# Patient Record
Sex: Female | Born: 1975 | Race: Black or African American | Hispanic: No | Marital: Married | Smoking: Never smoker
Health system: Southern US, Community
[De-identification: ages and names within clinical notes are randomized; demographics above are authoritative.]

## PROBLEM LIST (undated history)

## (undated) DIAGNOSIS — E559 Vitamin D deficiency, unspecified: Secondary | ICD-10-CM

## (undated) DIAGNOSIS — K429 Umbilical hernia without obstruction or gangrene: Secondary | ICD-10-CM

## (undated) DIAGNOSIS — Z8619 Personal history of other infectious and parasitic diseases: Secondary | ICD-10-CM

## (undated) DIAGNOSIS — E876 Hypokalemia: Secondary | ICD-10-CM

## (undated) DIAGNOSIS — I10 Essential (primary) hypertension: Secondary | ICD-10-CM

## (undated) DIAGNOSIS — A6000 Herpesviral infection of urogenital system, unspecified: Secondary | ICD-10-CM

## (undated) DIAGNOSIS — R7303 Prediabetes: Secondary | ICD-10-CM

## (undated) HISTORY — DX: Personal history of other infectious and parasitic diseases: Z86.19

## (undated) HISTORY — DX: Essential (primary) hypertension: I10

## (undated) HISTORY — DX: Hypokalemia: E87.6

## (undated) HISTORY — DX: Prediabetes: R73.03

## (undated) HISTORY — PX: WISDOM TOOTH EXTRACTION: SHX21

## (undated) HISTORY — DX: Herpesviral infection of urogenital system, unspecified: A60.00

## (undated) HISTORY — DX: Vitamin D deficiency, unspecified: E55.9

---

## 1998-07-10 ENCOUNTER — Emergency Department (HOSPITAL_COMMUNITY): Admission: EM | Admit: 1998-07-10 | Discharge: 1998-07-10 | Payer: Self-pay | Admitting: Emergency Medicine

## 1998-08-02 ENCOUNTER — Ambulatory Visit (HOSPITAL_COMMUNITY): Admission: RE | Admit: 1998-08-02 | Discharge: 1998-08-02 | Payer: Self-pay | Admitting: Emergency Medicine

## 1998-08-02 ENCOUNTER — Encounter: Payer: Self-pay | Admitting: Emergency Medicine

## 1998-08-02 ENCOUNTER — Emergency Department (HOSPITAL_COMMUNITY): Admission: EM | Admit: 1998-08-02 | Discharge: 1998-08-02 | Payer: Self-pay | Admitting: Emergency Medicine

## 1998-08-13 ENCOUNTER — Emergency Department (HOSPITAL_COMMUNITY): Admission: EM | Admit: 1998-08-13 | Discharge: 1998-08-13 | Payer: Self-pay

## 1998-10-21 ENCOUNTER — Other Ambulatory Visit: Admission: RE | Admit: 1998-10-21 | Discharge: 1998-10-21 | Payer: Self-pay | Admitting: *Deleted

## 1999-03-14 ENCOUNTER — Inpatient Hospital Stay (HOSPITAL_COMMUNITY): Admission: AD | Admit: 1999-03-14 | Discharge: 1999-03-14 | Payer: Self-pay | Admitting: *Deleted

## 1999-03-15 ENCOUNTER — Inpatient Hospital Stay (HOSPITAL_COMMUNITY): Admission: AD | Admit: 1999-03-15 | Discharge: 1999-03-17 | Payer: Self-pay | Admitting: Obstetrics and Gynecology

## 1999-09-21 ENCOUNTER — Emergency Department (HOSPITAL_COMMUNITY): Admission: EM | Admit: 1999-09-21 | Discharge: 1999-09-21 | Payer: Self-pay | Admitting: *Deleted

## 2000-05-03 ENCOUNTER — Encounter: Payer: Self-pay | Admitting: Emergency Medicine

## 2000-05-03 ENCOUNTER — Emergency Department (HOSPITAL_COMMUNITY): Admission: EM | Admit: 2000-05-03 | Discharge: 2000-05-03 | Payer: Self-pay | Admitting: Emergency Medicine

## 2000-05-18 ENCOUNTER — Emergency Department (HOSPITAL_COMMUNITY): Admission: EM | Admit: 2000-05-18 | Discharge: 2000-05-18 | Payer: Self-pay | Admitting: Emergency Medicine

## 2000-05-18 ENCOUNTER — Encounter: Payer: Self-pay | Admitting: Emergency Medicine

## 2000-11-17 ENCOUNTER — Encounter: Payer: Self-pay | Admitting: Emergency Medicine

## 2000-11-17 ENCOUNTER — Emergency Department (HOSPITAL_COMMUNITY): Admission: EM | Admit: 2000-11-17 | Discharge: 2000-11-17 | Payer: Self-pay | Admitting: Emergency Medicine

## 2001-01-30 ENCOUNTER — Other Ambulatory Visit: Admission: RE | Admit: 2001-01-30 | Discharge: 2001-01-30 | Payer: Self-pay | Admitting: Obstetrics and Gynecology

## 2001-06-04 ENCOUNTER — Inpatient Hospital Stay (HOSPITAL_COMMUNITY): Admission: AD | Admit: 2001-06-04 | Discharge: 2001-06-04 | Payer: Self-pay | Admitting: Obstetrics and Gynecology

## 2001-06-16 ENCOUNTER — Inpatient Hospital Stay (HOSPITAL_COMMUNITY): Admission: AD | Admit: 2001-06-16 | Discharge: 2001-06-18 | Payer: Self-pay | Admitting: Obstetrics and Gynecology

## 2002-04-26 ENCOUNTER — Emergency Department (HOSPITAL_COMMUNITY): Admission: EM | Admit: 2002-04-26 | Discharge: 2002-04-27 | Payer: Self-pay

## 2002-04-26 ENCOUNTER — Encounter: Payer: Self-pay | Admitting: Emergency Medicine

## 2002-12-21 ENCOUNTER — Inpatient Hospital Stay (HOSPITAL_COMMUNITY): Admission: AD | Admit: 2002-12-21 | Discharge: 2002-12-23 | Payer: Self-pay | Admitting: Family Medicine

## 2002-12-22 ENCOUNTER — Encounter (INDEPENDENT_AMBULATORY_CARE_PROVIDER_SITE_OTHER): Payer: Self-pay | Admitting: *Deleted

## 2008-09-12 ENCOUNTER — Emergency Department (HOSPITAL_COMMUNITY): Admission: EM | Admit: 2008-09-12 | Discharge: 2008-09-12 | Payer: Self-pay | Admitting: Emergency Medicine

## 2008-11-09 ENCOUNTER — Emergency Department (HOSPITAL_COMMUNITY): Admission: EM | Admit: 2008-11-09 | Discharge: 2008-11-10 | Payer: Self-pay | Admitting: Emergency Medicine

## 2010-06-12 ENCOUNTER — Emergency Department (HOSPITAL_COMMUNITY): Admission: EM | Admit: 2010-06-12 | Discharge: 2010-06-12 | Payer: Self-pay | Admitting: Emergency Medicine

## 2010-07-17 ENCOUNTER — Emergency Department (HOSPITAL_COMMUNITY): Admission: EM | Admit: 2010-07-17 | Discharge: 2010-07-17 | Payer: Self-pay | Admitting: Emergency Medicine

## 2010-08-07 ENCOUNTER — Encounter: Admission: RE | Admit: 2010-08-07 | Discharge: 2010-08-07 | Payer: Self-pay | Admitting: Family Medicine

## 2011-03-30 NOTE — H&P (Signed)
[facility]  Patient:    CELISSE, CIULLA                        MRN: [id number] Adm. Date:  [id number] Attending:  Shaune Spittle Dictator:   Nigel Bridgeman, C.N.M.                         History and Physical  DATE OF BIRTH:                [birth year]  HISTORY OF PRESENT ILLNESS:   Ms. Lorine Bears is a 35 year old gravida 2, para 1-0-0-1 at 40-5/7 weeks who presents with uterine contractions since 4 a.m.  She denies any leaking or bleeding and reports positive fetal movement.  Pregnancy has been remarkable for:  1. History of migraines. 2. History of first trimester UTI. 3. Fibroid. 4. Late to care. 5. Bacterial vaginosis first trimester.  PRENATAL LABORATORY DATA:     Blood type is AB+, Rh antibody negative, VDRL nonreactive, rubella titer positive, hepatitis B surface antigen negative. HIV was declined.  GC and chlamydia cultures were negative in the first trimester.  Pap was normal.  Glucose challenge was normal.  AFP was deferred secondary to advanced gestational age.  Sickle test was negative.  Hemoglobin upon entering the practice was 10.9.  It was 11.1 at 30 weeks.  Group B Strep culture was negative at 36 weeks.  EDC of June 10, 2001 was established by last menstrual period and was in agreement with ultrasound at approximately 11 and 18 weeks.  HISTORY OF PRESENT PREGNANCY: The patient entered care at approximately 21 weeks.  She had an ultrasound at approximately 11 weeks that documented ______.  She also had one at 24 weeks which showed normal growth and fluid. The rest of her pregnancy was uncomplicated.  PAST OBSTETRICAL HISTORY:     In May 2000, she had a vaginal birth of a female infant who weighed 6 pounds 2 ounces at 39-5/7 weeks.  She was in labor 11-1/2 hours.  She had no anesthesia.  PAST MEDICAL HISTORY:         She was an OCP and condom user.  She had chickenpox in the 12th grade.  She has a heart murmur but has never  had any treatment.  She had a history of pyelonephritis two years ago with a UTI in January 2002.  She has history of migraines.  She had a slipped disk in her lower back secondary to a fall.  She has been treated with chiropractic care. It has no residual issues.  She had her wisdom teeth removed in the past.  Her only other hospitalization was for childbirth.  ALLERGIES:                    She has no known medication allergies.  FAMILY HISTORY:               Her maternal grandmother and maternal aunt and mother had hypertension.  Father of the babys father had hypertension.  The patients mother has varicosities.  Her mother, brother, father, baby, and paternal sister all have asthma.  Maternal aunt has insulin-dependent diabetes.  Her brother had seizures as a child.  GENETIC HISTORY:              Unremarkable.  SOCIAL HISTORY:  The patient is single.  The father of the baby is involved and supportive.  His name is Remer Macho.  The patient is currently unemployed.  She is African-American of the [employer].  She has been followed by the certified nurse midwife service at [facility].  She denies any alcohol, drug, or tobacco use during this pregnancy.  PHYSICAL EXAMINATION:  VITAL SIGNS:                  Stable.  The patient is afebrile.  HEENT:                        Within normal limits.  LUNGS:                        Breath sounds are clear.  HEART:                        Regular rate and rhythm without murmur.  BREASTS:                      Soft and nontender.  ABDOMEN:                      Fundal height is approximately 38 cm.  Estimated fetal weight is 7-1/2 to 8 pounds.  Uterine contractions are every 3-5 minutes.  Mild quality with irritability noted.  Cervix is 5-6 cm, 80% vertex at a 0 station with bulging bag of water.  Fetal heart rate is reactive with no decelerations.  EXTREMITIES:                  Deep tendon reflexes are 2+ without  clonus. There is trace edema noted.  IMPRESSION:                   1. Intrauterine pregnancy at 40-5/7 weeks.                               2. Active labor.                               3. Negative Group B Strep.  PLAN:                         1. Admit to birthing suite for consult with                                  Dr. Dierdre Forth as attending physician.                               2. Routine certified nurse midwife orders.                               3. The patient declines epidural at present.                               4. Anticipate normal spontaneous vaginal  birth. DD:  06/16/01 TD:  06/16/01 Job: [id number]

## 2011-06-02 ENCOUNTER — Emergency Department (HOSPITAL_COMMUNITY)
Admission: EM | Admit: 2011-06-02 | Discharge: 2011-06-02 | Disposition: A | Discharge status: Home / Self Care | Payer: No Typology Code available for payment source | Attending: Emergency Medicine | Admitting: Emergency Medicine

## 2011-06-02 DIAGNOSIS — M545 Low back pain, unspecified: Secondary | ICD-10-CM | POA: Insufficient documentation

## 2011-06-02 DIAGNOSIS — M542 Cervicalgia: Secondary | ICD-10-CM | POA: Insufficient documentation

## 2011-06-02 DIAGNOSIS — M25519 Pain in unspecified shoulder: Secondary | ICD-10-CM | POA: Insufficient documentation

## 2011-06-02 DIAGNOSIS — I10 Essential (primary) hypertension: Secondary | ICD-10-CM | POA: Insufficient documentation

## 2011-06-02 DIAGNOSIS — S335XXA Sprain of ligaments of lumbar spine, initial encounter: Secondary | ICD-10-CM | POA: Insufficient documentation

## 2011-06-02 DIAGNOSIS — S139XXA Sprain of joints and ligaments of unspecified parts of neck, initial encounter: Secondary | ICD-10-CM | POA: Insufficient documentation

## 2011-06-29 ENCOUNTER — Ambulatory Visit
Admission: RE | Admit: 2011-06-29 | Discharge: 2011-06-29 | Disposition: A | Discharge status: Home / Self Care | Payer: Managed Care, Other (non HMO) | Source: Ambulatory Visit | Attending: Medical | Admitting: Medical

## 2011-06-29 ENCOUNTER — Ambulatory Visit (INDEPENDENT_AMBULATORY_CARE_PROVIDER_SITE_OTHER): Payer: Managed Care, Other (non HMO) | Admitting: Medical

## 2011-06-29 ENCOUNTER — Encounter: Payer: Self-pay | Admitting: Medical

## 2011-06-29 VITALS — BP 140/90 | HR 88 | Temp 98.4°F | Resp 20 | Ht 70.0 in | Wt 228.0 lb

## 2011-06-29 DIAGNOSIS — R51 Headache: Secondary | ICD-10-CM

## 2011-06-29 DIAGNOSIS — M545 Low back pain: Secondary | ICD-10-CM

## 2011-06-29 DIAGNOSIS — M25519 Pain in unspecified shoulder: Secondary | ICD-10-CM

## 2011-06-29 DIAGNOSIS — M542 Cervicalgia: Secondary | ICD-10-CM

## 2011-06-29 DIAGNOSIS — M25512 Pain in left shoulder: Secondary | ICD-10-CM

## 2011-06-29 DIAGNOSIS — I10 Essential (primary) hypertension: Secondary | ICD-10-CM

## 2011-06-29 MED ORDER — LISINOPRIL-HYDROCHLOROTHIAZIDE 20-25 MG PO TABS: 1.0000 | ORAL_TABLET | Freq: Every day | ORAL | Status: DC
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
## 2011-07-02 NOTE — Progress Notes (Signed)
.  ks

## 2011-07-03 ENCOUNTER — Telehealth: Payer: Self-pay

## 2011-07-03 NOTE — Telephone Encounter (Signed)
Called pt to inform her x-rays are normal and her l spine is in her chart and normal left message if she hasnt heard from pt in 3 to 4 days to call me

## 2011-07-03 NOTE — Progress Notes (Signed)
Advised pt of results and r/s appt with Dr. Susann Givens for Friday.

## 2011-07-06 ENCOUNTER — Encounter: Payer: Self-pay | Admitting: Family Medicine

## 2011-07-06 ENCOUNTER — Ambulatory Visit (INDEPENDENT_AMBULATORY_CARE_PROVIDER_SITE_OTHER): Payer: Managed Care, Other (non HMO) | Admitting: Family Medicine

## 2011-07-06 VITALS — BP 160/100 | HR 70 | Wt 234.0 lb

## 2011-07-06 DIAGNOSIS — I10 Essential (primary) hypertension: Secondary | ICD-10-CM

## 2011-07-06 DIAGNOSIS — Z79899 Other long term (current) drug therapy: Secondary | ICD-10-CM

## 2011-07-06 MED ORDER — AMLODIPINE BESYLATE 5 MG PO TABS: 5.0000 mg | ORAL_TABLET | Freq: Every day | ORAL | Status: DC
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
## 2011-08-10 ENCOUNTER — Encounter: Payer: Self-pay | Admitting: Family Medicine

## 2011-08-10 ENCOUNTER — Ambulatory Visit (INDEPENDENT_AMBULATORY_CARE_PROVIDER_SITE_OTHER): Payer: Managed Care, Other (non HMO) | Admitting: Family Medicine

## 2011-08-10 VITALS — BP 140/100 | HR 80 | Wt 233.0 lb

## 2011-08-10 DIAGNOSIS — I10 Essential (primary) hypertension: Secondary | ICD-10-CM

## 2011-08-10 DIAGNOSIS — Z23 Encounter for immunization: Secondary | ICD-10-CM

## 2011-08-10 DIAGNOSIS — J301 Allergic rhinitis due to pollen: Secondary | ICD-10-CM | POA: Insufficient documentation

## 2011-08-10 MED ORDER — AMLODIPINE BESYLATE 10 MG PO TABS: 10.0000 mg | ORAL_TABLET | Freq: Every day | ORAL | Status: DC

## 2011-08-10 MED ORDER — FLUTICASONE PROPIONATE 50 MCG/ACT NA SUSP: 2.0000 | Freq: Every day | NASAL | Status: DC

## 2011-08-10 NOTE — Progress Notes (Signed)
  Subjective:    Patient ID: Debbie Gibson, female    DOB: 01-08-1976, 35 y.o.   MRN: [id number]  HPI She is here for recheck. She continues on medications listed in the chart. She is also having difficulty with sneezing, itchy watery eyes, PND but no fever, chills or sore throat.   Review of Systems     Objective:   Physical Exam alert and in no distress. Tympanic membranes and canals are normal. Throat is clear. Tonsils are normal. Neck is supple without adenopathy or thyromegaly. Cardiac exam shows a regular sinus rhythm without murmurs or gallops. Lungs are clear to auscultation.        Assessment & Plan:   1. Allergic rhinitis due to pollen   2. Hypertension    Increase Norvasc to 10 mg. She will also start Flonase. Recheck here in one month.

## 2011-08-10 NOTE — Patient Instructions (Signed)
Use the nasal spray on a daily basis. He may also use the over-the-counter pills. Recheck here in one month for blood pressure

## 2011-08-16 LAB — URINALYSIS, ROUTINE W REFLEX MICROSCOPIC
Glucose, UA: NEGATIVE mg/dL
Ketones, ur: NEGATIVE mg/dL
Protein, ur: NEGATIVE mg/dL

## 2011-08-16 LAB — HCG, QUANTITATIVE, PREGNANCY

## 2011-08-16 LAB — CARBOXYHEMOGLOBIN
Carboxyhemoglobin: 0.8 % (ref 0.5–1.5)
O2 Saturation: 97.9 %
Total hemoglobin: 11.6 g/dL — ABNORMAL LOW (ref 12.5–16.0)

## 2011-08-16 LAB — DIFFERENTIAL
Basophils Absolute: 0 10*3/uL (ref 0.0–0.1)
Eosinophils Relative: 1 % (ref 0–5)
Lymphocytes Relative: 35 % (ref 12–46)
Neutro Abs: 3.4 10*3/uL (ref 1.7–7.7)
Neutrophils Relative %: 56 % (ref 43–77)

## 2011-08-16 LAB — URINE CULTURE: Colony Count: 85000

## 2011-08-16 LAB — COMPREHENSIVE METABOLIC PANEL
BUN: 10 mg/dL (ref 6–23)
CO2: 22 mEq/L (ref 19–32)
Calcium: 9.1 mg/dL (ref 8.4–10.5)
Creatinine, Ser: 0.7 mg/dL (ref 0.4–1.2)
GFR calc non Af Amer: >60 mL/min (ref >60)
Glucose, Bld: 95 mg/dL (ref 70–99)
Potassium: 3.4 mEq/L — ABNORMAL LOW (ref 3.5–5.1)
Sodium: 134 mEq/L — ABNORMAL LOW (ref 135–145)
Total Protein: 7.9 g/dL (ref 6.0–8.3)

## 2011-08-16 LAB — CBC
Hemoglobin: 12.6 g/dL (ref 12.0–15.0)
Platelets: 208 10*3/uL (ref 150–400)
RDW: 14.3 % (ref 11.5–15.5)

## 2011-08-16 LAB — WET PREP, GENITAL

## 2011-08-16 LAB — POCT PREGNANCY, URINE: Preg Test, Ur: POSITIVE

## 2011-09-14 ENCOUNTER — Ambulatory Visit: Payer: Managed Care, Other (non HMO) | Admitting: Family Medicine

## 2011-12-06 ENCOUNTER — Emergency Department (HOSPITAL_COMMUNITY)
Admission: EM | Admit: 2011-12-06 | Discharge: 2011-12-06 | Disposition: A | Discharge status: Home / Self Care | Payer: Managed Care, Other (non HMO) | Attending: Emergency Medicine | Admitting: Emergency Medicine

## 2011-12-06 ENCOUNTER — Encounter (HOSPITAL_COMMUNITY): Payer: Self-pay | Admitting: *Deleted

## 2011-12-06 DIAGNOSIS — M545 Low back pain, unspecified: Secondary | ICD-10-CM | POA: Insufficient documentation

## 2011-12-06 DIAGNOSIS — I10 Essential (primary) hypertension: Secondary | ICD-10-CM | POA: Insufficient documentation

## 2011-12-06 DIAGNOSIS — M62830 Muscle spasm of back: Secondary | ICD-10-CM

## 2011-12-06 DIAGNOSIS — M5380 Other specified dorsopathies, site unspecified: Secondary | ICD-10-CM | POA: Insufficient documentation

## 2011-12-06 DIAGNOSIS — R109 Unspecified abdominal pain: Secondary | ICD-10-CM | POA: Insufficient documentation

## 2011-12-06 DIAGNOSIS — Z79899 Other long term (current) drug therapy: Secondary | ICD-10-CM | POA: Insufficient documentation

## 2011-12-06 MED ORDER — KETOROLAC TROMETHAMINE 30 MG/ML IJ SOLN
30.0000 mg | Freq: Once | INTRAMUSCULAR | Status: AC
  Administered 2011-12-06: 30 mg via INTRAMUSCULAR
  Filled 2011-12-06: qty 1

## 2011-12-06 MED ORDER — DIAZEPAM 5 MG PO TABS: 5.0000 mg | ORAL_TABLET | Freq: Two times a day (BID) | ORAL | Status: AC

## 2011-12-06 MED ORDER — DIAZEPAM 5 MG PO TABS
5.0000 mg | ORAL_TABLET | Freq: Once | ORAL | Status: AC
  Administered 2011-12-06: 5 mg via ORAL
  Filled 2011-12-06: qty 1
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
## 2011-12-06 NOTE — ED Notes (Signed)
Pt reports intermittent (L) flank pain since Friday.  States that pain increases with movement and twisting.  Denies urinary symptoms. Pt nontender on palpation.

## 2011-12-07 ENCOUNTER — Encounter: Payer: Self-pay | Admitting: Family Medicine

## 2011-12-07 ENCOUNTER — Ambulatory Visit (INDEPENDENT_AMBULATORY_CARE_PROVIDER_SITE_OTHER): Payer: Managed Care, Other (non HMO) | Admitting: Family Medicine

## 2011-12-07 VITALS — BP 164/118 | HR 72 | Temp 98.6°F | Ht 68.0 in | Wt 248.0 lb

## 2011-12-07 DIAGNOSIS — M545 Low back pain: Secondary | ICD-10-CM

## 2011-12-07 DIAGNOSIS — M5380 Other specified dorsopathies, site unspecified: Secondary | ICD-10-CM

## 2011-12-07 DIAGNOSIS — M62830 Muscle spasm of back: Secondary | ICD-10-CM

## 2011-12-07 DIAGNOSIS — I10 Essential (primary) hypertension: Secondary | ICD-10-CM | POA: Insufficient documentation

## 2011-12-07 LAB — POCT URINALYSIS DIPSTICK
Bilirubin, UA: NEGATIVE
Glucose, UA: NEGATIVE
Spec Grav, UA: 1.015
Urobilinogen, UA: NEGATIVE

## 2011-12-07 MED ORDER — METHOCARBAMOL 500 MG PO TABS: ORAL_TABLET | ORAL | Status: DC

## 2011-12-07 MED ORDER — LISINOPRIL-HYDROCHLOROTHIAZIDE 20-25 MG PO TABS: 1.0000 | ORAL_TABLET | Freq: Every day | ORAL | Status: DC

## 2011-12-07 NOTE — Patient Instructions (Addendum)
Restart Lisinopril HCTZ, AND amlodipine 10mg .  Continue to monitor BP regularly. If BP drops and having dizziness, may cut lisinopril/HCTZ to 1/2 tablet (ie if BP <100/50). Return for fasting med check in 3-4 weeks, so we can determine if her BP is adequately controlled, and also to do labs.  You MUST avoid pregnancy with the medications you are taking.  Please use condoms regularly, or discuss other options and follow up visit  Muscle spasm/back strain.  Continue heat. Start stretches, massage. Resume previous home exercises for back from physical therapy.  Take Aleve 2 pills twice daily with foot.  Continue Valium at bedtime, but start Robaxin 1-2 tablets every 6 hours during the day if needed for muscle spasm.  Don't overlap both types of muscle relaxants.  URI: no evidence of sinus infection.  Sinuses rinse kit or Neti-pot, Mucinex (not the D kind) and/or antihistamines such as zyrtec, claritin or coricidin HBP.  Return if symptoms persist or worsen.
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
## 2011-12-12 NOTE — ED Provider Notes (Signed)
Evaluation and management procedures were performed by the PA/NP under my supervision/collaboration.    Felisa Bonier, MD 12/12/11 2001

## 2011-12-21 ENCOUNTER — Encounter: Payer: Self-pay | Admitting: Internal Medicine

## 2011-12-28 ENCOUNTER — Ambulatory Visit (INDEPENDENT_AMBULATORY_CARE_PROVIDER_SITE_OTHER): Payer: Managed Care, Other (non HMO) | Admitting: Family Medicine

## 2011-12-28 ENCOUNTER — Encounter: Payer: Self-pay | Admitting: Family Medicine

## 2011-12-28 DIAGNOSIS — I10 Essential (primary) hypertension: Secondary | ICD-10-CM

## 2011-12-28 DIAGNOSIS — J301 Allergic rhinitis due to pollen: Secondary | ICD-10-CM

## 2011-12-28 MED ORDER — AMLODIPINE BESYLATE 5 MG PO TABS: 5.0000 mg | ORAL_TABLET | Freq: Every day | ORAL | Status: DC

## 2011-12-28 NOTE — Patient Instructions (Signed)
Stay on the Flonase and switch to either Allegra or Claritin.

## 2011-12-28 NOTE — Progress Notes (Signed)
  Subjective:    Patient ID: Debbie Gibson, female    DOB: 05-10-1976, 36 y.o.   MRN: [id number]  HPI She is here for blood pressure recheck. She is been checking them at home and has been getting readings in the 130 range. She also has underlying allergies and has had difficulty controlling her symptoms with the use of Flonase and Zyrtec. She continues to work and is going to school. Presently she is taking 18 hours at [employer] She has started to work out and she has given up soft drinks.   Review of Systems     Objective:   Physical Exam alert and in no distress. Tympanic membranes and canals are normal. Throat is clear. Tonsils are normal. Neck is supple without adenopathy or thyromegaly. Cardiac exam shows a regular sinus rhythm without murmurs or gallops. Lungs are clear to auscultation.       Assessment & Plan:   1. Hypertension   2. Allergic rhinitis due to pollen   Stay on the Flonase and switch to either Allegra or Claritin. Continue on present medications for hypertension

## 2012-01-28 DIAGNOSIS — Z0279 Encounter for issue of other medical certificate: Secondary | ICD-10-CM

## 2012-08-27 ENCOUNTER — Emergency Department (HOSPITAL_COMMUNITY)
Admission: EM | Admit: 2012-08-27 | Discharge: 2012-08-27 | Disposition: A | Discharge status: Home / Self Care | Payer: Managed Care, Other (non HMO) | Source: Home / Self Care | Attending: Emergency Medicine | Admitting: Emergency Medicine

## 2012-08-27 ENCOUNTER — Encounter (HOSPITAL_COMMUNITY): Payer: Self-pay

## 2012-08-27 DIAGNOSIS — J329 Chronic sinusitis, unspecified: Secondary | ICD-10-CM

## 2012-08-27 DIAGNOSIS — T148XXA Other injury of unspecified body region, initial encounter: Secondary | ICD-10-CM

## 2012-08-27 LAB — POCT URINALYSIS DIP (DEVICE)
Glucose, UA: NEGATIVE mg/dL
Leukocytes, UA: NEGATIVE
Nitrite: NEGATIVE
Urobilinogen, UA: 0.2 mg/dL (ref 0.0–1.0)

## 2012-08-27 LAB — POCT PREGNANCY, URINE: Preg Test, Ur: NEGATIVE

## 2012-08-27 MED ORDER — AMOXICILLIN 875 MG PO TABS: 875.0000 mg | ORAL_TABLET | Freq: Two times a day (BID) | ORAL | Status: DC
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
## 2012-08-27 NOTE — ED Notes (Signed)
Patient states that she has had sinus pain and pressure, drainage for approx 2 weeks, worse at night , OTC Zyrtec/Claritin not working, also complains of pain in her left side, with urinary frequency but very little when she does go

## 2012-08-28 ENCOUNTER — Encounter (HOSPITAL_COMMUNITY): Payer: Self-pay | Admitting: *Deleted

## 2012-08-28 DIAGNOSIS — R911 Solitary pulmonary nodule: Secondary | ICD-10-CM | POA: Insufficient documentation

## 2012-08-28 DIAGNOSIS — E876 Hypokalemia: Secondary | ICD-10-CM | POA: Insufficient documentation

## 2012-08-28 DIAGNOSIS — Z79899 Other long term (current) drug therapy: Secondary | ICD-10-CM | POA: Insufficient documentation

## 2012-08-28 DIAGNOSIS — I10 Essential (primary) hypertension: Secondary | ICD-10-CM | POA: Insufficient documentation

## 2012-08-28 DIAGNOSIS — R109 Unspecified abdominal pain: Secondary | ICD-10-CM | POA: Insufficient documentation

## 2012-08-28 DIAGNOSIS — K429 Umbilical hernia without obstruction or gangrene: Secondary | ICD-10-CM | POA: Insufficient documentation

## 2012-08-28 LAB — POCT I-STAT, CHEM 8
Calcium, Ion: 1.2 mmol/L (ref 1.12–1.23)
Chloride: 101 mEq/L (ref 96–112)
HCT: 42 % (ref 36.0–46.0)
TCO2: 27 mmol/L (ref 0–100)

## 2012-08-28 LAB — POCT PREGNANCY, URINE: Preg Test, Ur: NEGATIVE

## 2012-08-28 NOTE — ED Notes (Addendum)
Seen at [facility] last night for sinus issues. Urine testing done then. Reports has had L lower back pain for ~ 1.5 weeks. Here tonight b/c pain has moved to periumbilical area and gotten worse. (denies: fever, nvd, back pain, bleeding, urinary or vaginal sx), some nausea earlier. Last BM yesterday (normal), last ate 1500.  Pinpoints initial pain 1.5 weeks ago to L mid axillary side between ribs & hip, now pain behind umbilicus.

## 2012-08-28 NOTE — ED Provider Notes (Signed)
Medical screening examination/treatment/procedure(s) were performed by non-physician practitioner and as supervising physician I was immediately available for consultation/collaboration.  Leslee Home, M.D.   Reuben Likes, MD 08/28/12 [phone]

## 2012-08-29 ENCOUNTER — Encounter (HOSPITAL_COMMUNITY): Payer: Self-pay | Admitting: Radiology

## 2012-08-29 ENCOUNTER — Emergency Department (HOSPITAL_COMMUNITY): Payer: Managed Care, Other (non HMO)

## 2012-08-29 ENCOUNTER — Emergency Department (HOSPITAL_COMMUNITY)
Admission: EM | Admit: 2012-08-29 | Discharge: 2012-08-29 | Disposition: A | Discharge status: Home / Self Care | Payer: Managed Care, Other (non HMO) | Attending: Emergency Medicine | Admitting: Emergency Medicine

## 2012-08-29 DIAGNOSIS — K429 Umbilical hernia without obstruction or gangrene: Secondary | ICD-10-CM

## 2012-08-29 DIAGNOSIS — R109 Unspecified abdominal pain: Secondary | ICD-10-CM

## 2012-08-29 DIAGNOSIS — E876 Hypokalemia: Secondary | ICD-10-CM

## 2012-08-29 DIAGNOSIS — R911 Solitary pulmonary nodule: Secondary | ICD-10-CM

## 2012-08-29 LAB — URINALYSIS, ROUTINE W REFLEX MICROSCOPIC
Ketones, ur: NEGATIVE mg/dL
Nitrite: NEGATIVE
pH: 5 (ref 5.0–8.0)

## 2012-08-29 LAB — CBC WITH DIFFERENTIAL/PLATELET
Basophils Absolute: 0 10*3/uL (ref 0.0–0.1)
Eosinophils Relative: 2 % (ref 0–5)
Lymphocytes Relative: 39 % (ref 12–46)
MCV: 81.9 fL (ref 78.0–100.0)
Neutro Abs: 3.8 10*3/uL (ref 1.7–7.7)
Neutrophils Relative %: 52 % (ref 43–77)
Platelets: 243 10*3/uL (ref 150–400)
RDW: 14.8 % (ref 11.5–15.5)
WBC: 7.4 10*3/uL (ref 4.0–10.5)

## 2012-08-29 LAB — URINE MICROSCOPIC-ADD ON

## 2012-08-29 MED ORDER — HYDROCODONE-ACETAMINOPHEN 5-325 MG PO TABS
1.0000 | ORAL_TABLET | ORAL | Status: DC | PRN
Start: 2012-08-29 — End: 2012-12-31

## 2012-08-29 MED ORDER — OXYCODONE-ACETAMINOPHEN 5-325 MG PO TABS
2.0000 | ORAL_TABLET | Freq: Once | ORAL | Status: AC
  Administered 2012-08-29: 2 via ORAL
  Filled 2012-08-29: qty 2

## 2012-08-29 MED ORDER — POTASSIUM CHLORIDE CRYS ER 20 MEQ PO TBCR: 40.0000 meq | EXTENDED_RELEASE_TABLET | Freq: Once | ORAL | Status: DC

## 2012-08-29 MED ORDER — POTASSIUM CHLORIDE CRYS ER 20 MEQ PO TBCR
40.0000 meq | EXTENDED_RELEASE_TABLET | Freq: Once | ORAL | Status: AC
  Administered 2012-08-29: 40 meq via ORAL
  Filled 2012-08-29: qty 2

## 2012-08-29 NOTE — ED Notes (Signed)
Patient transported to CT 

## 2012-08-29 NOTE — ED Provider Notes (Signed)
Medical screening examination/treatment/procedure(s) were conducted as a shared visit with non-physician practitioner(s) and myself.     Brandt Loosen, MD 08/29/12 [phone]
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
## 2012-08-30 LAB — URINE CULTURE: Colony Count: 40000

## 2012-09-22 ENCOUNTER — Encounter (INDEPENDENT_AMBULATORY_CARE_PROVIDER_SITE_OTHER): Payer: Self-pay | Admitting: General Surgery

## 2012-09-22 ENCOUNTER — Ambulatory Visit (INDEPENDENT_AMBULATORY_CARE_PROVIDER_SITE_OTHER): Payer: Managed Care, Other (non HMO) | Admitting: General Surgery

## 2012-09-22 VITALS — BP 142/90 | HR 75 | Temp 97.5°F | Resp 18 | Ht 69.0 in | Wt 250.2 lb

## 2012-09-22 DIAGNOSIS — K429 Umbilical hernia without obstruction or gangrene: Secondary | ICD-10-CM

## 2012-09-22 NOTE — Patient Instructions (Signed)
Plan for umbilical hernia repair with mesh 

## 2012-09-22 NOTE — Progress Notes (Signed)
Subjective:     Patient ID: Debbie Gibson, female   DOB: 10/17/1976, 36 y.o.   MRN: [id number]  HPI We're asked to see the patient in consultation by Dr. Manly to evaluate her for an umbilical hernia. The patient is a 36-year-old black female who began experiencing sharp pain just to the left of her umbilicus a couple weeks ago. The pain was severe enough that she went to the emergency department. A CT scan was obtained that showed a small umbilical hernia but there was no involvement of the intestines. She has had some nausea but no vomiting. She denies any fevers or chills.  Review of Systems  Constitutional: Negative.   HENT: Negative.   Eyes: Negative.   Respiratory: Negative.   Cardiovascular: Negative.   Gastrointestinal: Negative.   Genitourinary: Negative.   Musculoskeletal: Negative.   Skin: Negative.   Neurological: Negative.   Hematological: Negative.   Psychiatric/Behavioral: Negative.        Objective:   Physical Exam  Constitutional: She is oriented to person, place, and time. She appears well-developed and well-nourished.  HENT:  Head: Normocephalic and atraumatic.  Eyes: Conjunctivae normal and EOM are normal. Pupils are equal, round, and reactive to light.  Neck: Normal range of motion. Neck supple.  Cardiovascular: Normal rate, regular rhythm and normal heart sounds.   Pulmonary/Chest: Effort normal and breath sounds normal.  Abdominal: Soft. Bowel sounds are normal.       There is a small umbilical hernia that can be appreciated.  Musculoskeletal: Normal range of motion.  Neurological: She is alert and oriented to person, place, and time.  Skin: Skin is warm and dry.  Psychiatric: She has a normal mood and affect. Her behavior is normal.       Assessment:     The patient has a small but symptomatic umbilical hernia. Because of the risk of incarceration stimulation at think she would benefit from having this fixed. She would also like to have this  done. I have discussed with her in detail the risks and benefits of the operation to fix the hernia as well as some of the technical aspects including the possibility of using mesh and she understands and wishes to proceed.    Plan:     Plan for umbilical hernia repair with mesh      

## 2012-09-26 ENCOUNTER — Encounter: Payer: Self-pay | Admitting: Family Medicine

## 2012-09-26 ENCOUNTER — Ambulatory Visit (INDEPENDENT_AMBULATORY_CARE_PROVIDER_SITE_OTHER): Payer: Managed Care, Other (non HMO) | Admitting: Family Medicine

## 2012-09-26 VITALS — BP 150/110 | Wt 252.0 lb

## 2012-09-26 DIAGNOSIS — I10 Essential (primary) hypertension: Secondary | ICD-10-CM

## 2012-09-26 DIAGNOSIS — K429 Umbilical hernia without obstruction or gangrene: Secondary | ICD-10-CM

## 2012-09-26 DIAGNOSIS — J984 Other disorders of lung: Secondary | ICD-10-CM

## 2012-09-26 DIAGNOSIS — E669 Obesity, unspecified: Secondary | ICD-10-CM

## 2012-09-26 DIAGNOSIS — Z23 Encounter for immunization: Secondary | ICD-10-CM

## 2012-09-26 DIAGNOSIS — R911 Solitary pulmonary nodule: Secondary | ICD-10-CM

## 2012-09-26 MED ORDER — LISINOPRIL-HYDROCHLOROTHIAZIDE 20-25 MG PO TABS: 1.0000 | ORAL_TABLET | Freq: Every day | ORAL | Status: DC

## 2012-09-26 MED ORDER — AMLODIPINE BESYLATE 5 MG PO TABS: 5.0000 mg | ORAL_TABLET | Freq: Every day | ORAL | Status: DC

## 2012-09-26 NOTE — Progress Notes (Signed)
  Subjective:    Patient ID: Debbie Gibson, female    DOB: 02-10-1976, 36 y.o.   MRN: [id number]  HPI She is here for followup on recent hospital visit. The emergency room record was reviewed. She has an umbilical hernia and is scheduled for surgery. As part of process a 3 mm lung lesion was noted. She also has hypertension and needs followup on this. She has no history of chest pain, shortness of breath, coughing. She does have concerns over her weight.   Review of Systems     Objective:   Physical Exam Alert and in no distress. Cardiac exam shows regular rhythm without murmurs gallops. Lungs are clear to auscultation.       Assessment & Plan:   1. Hypertension  lisinopril-hydrochlorothiazide (PRINZIDE,ZESTORETIC) 20-25 MG per tablet, amLODipine (NORVASC) 5 MG tablet  2. Umbilical hernia    3. Essential hypertension, benign  lisinopril-hydrochlorothiazide (PRINZIDE,ZESTORETIC) 20-25 MG per tablet  4. Lesion of lung  DG Chest 2 View  5. Obesity (BMI 30-39.9)     I will initially do a chest x-ray for the lung lesion. This is probably an incidental finding. May need followup with a CT in 3-6 months. She is scheduled for surgery for the umbilical hernia. Recommend she keep track of her blood pressures and bring the readings in as well as her blood pressure cuff to compare against ours in one month. Discussed obesity in regard to diet and exercise. Encouraged her to cut back on carbohydrates. Flu shot was given with risks and benefits discussed.

## 2012-09-26 NOTE — Patient Instructions (Addendum)
Come back in a month and bring your blood pressure cuffso we can compare with you Cut back on "white food" DASH diet

## 2012-09-30 ENCOUNTER — Ambulatory Visit
Admission: RE | Admit: 2012-09-30 | Discharge: 2012-09-30 | Disposition: A | Discharge status: Home / Self Care | Payer: Managed Care, Other (non HMO) | Source: Ambulatory Visit | Attending: Family Medicine | Admitting: Family Medicine

## 2012-09-30 DIAGNOSIS — R911 Solitary pulmonary nodule: Secondary | ICD-10-CM

## 2012-09-30 NOTE — Progress Notes (Signed)
Quick Note:  called pt [phone] pt informed she verbalized understanding ______

## 2012-09-30 NOTE — Progress Notes (Signed)
Quick Note:  Let her know that the x-ray is negative. Schedule her for a followup CT scan in 6 months. ______

## 2012-10-12 DIAGNOSIS — K429 Umbilical hernia without obstruction or gangrene: Secondary | ICD-10-CM

## 2012-10-12 HISTORY — DX: Umbilical hernia without obstruction or gangrene: K42.9

## 2012-10-13 ENCOUNTER — Other Ambulatory Visit: Payer: Self-pay

## 2012-10-13 ENCOUNTER — Encounter (HOSPITAL_BASED_OUTPATIENT_CLINIC_OR_DEPARTMENT_OTHER): Payer: Self-pay | Admitting: *Deleted

## 2012-10-13 ENCOUNTER — Encounter (HOSPITAL_BASED_OUTPATIENT_CLINIC_OR_DEPARTMENT_OTHER)
Admission: RE | Admit: 2012-10-13 | Discharge: 2012-10-13 | Disposition: A | Discharge status: Home / Self Care | Payer: Managed Care, Other (non HMO) | Source: Ambulatory Visit | Attending: General Surgery | Admitting: General Surgery

## 2012-10-13 LAB — BASIC METABOLIC PANEL
BUN: 10 mg/dL (ref 6–23)
Calcium: 9.4 mg/dL (ref 8.4–10.5)
Creatinine, Ser: 0.82 mg/dL (ref 0.50–1.10)
GFR calc Af Amer: >90 mL/min (ref >90)

## 2012-10-13 NOTE — Pre-Procedure Instructions (Signed)
To come for BMET and EKG 

## 2012-10-15 ENCOUNTER — Encounter (HOSPITAL_BASED_OUTPATIENT_CLINIC_OR_DEPARTMENT_OTHER): Payer: Self-pay | Admitting: Anesthesiology

## 2012-10-15 ENCOUNTER — Encounter (HOSPITAL_BASED_OUTPATIENT_CLINIC_OR_DEPARTMENT_OTHER)
Admission: RE | Disposition: A | Discharge status: Home / Self Care | Payer: Self-pay | Source: Ambulatory Visit | Attending: General Surgery

## 2012-10-15 ENCOUNTER — Encounter (HOSPITAL_BASED_OUTPATIENT_CLINIC_OR_DEPARTMENT_OTHER): Payer: Self-pay | Admitting: *Deleted

## 2012-10-15 ENCOUNTER — Ambulatory Visit (HOSPITAL_BASED_OUTPATIENT_CLINIC_OR_DEPARTMENT_OTHER)
Admission: RE | Admit: 2012-10-15 | Discharge: 2012-10-15 | Disposition: A | Discharge status: Home / Self Care | Payer: Managed Care, Other (non HMO) | Source: Ambulatory Visit | Attending: General Surgery | Admitting: General Surgery

## 2012-10-15 ENCOUNTER — Ambulatory Visit (HOSPITAL_BASED_OUTPATIENT_CLINIC_OR_DEPARTMENT_OTHER): Payer: Managed Care, Other (non HMO) | Admitting: Anesthesiology

## 2012-10-15 DIAGNOSIS — Z01812 Encounter for preprocedural laboratory examination: Secondary | ICD-10-CM | POA: Insufficient documentation

## 2012-10-15 DIAGNOSIS — I10 Essential (primary) hypertension: Secondary | ICD-10-CM | POA: Insufficient documentation

## 2012-10-15 DIAGNOSIS — K429 Umbilical hernia without obstruction or gangrene: Secondary | ICD-10-CM

## 2012-10-15 DIAGNOSIS — Z6837 Body mass index (BMI) 37.0-37.9, adult: Secondary | ICD-10-CM | POA: Insufficient documentation

## 2012-10-15 HISTORY — PX: INSERTION OF MESH: SHX5868

## 2012-10-15 HISTORY — DX: Umbilical hernia without obstruction or gangrene: K42.9

## 2012-10-15 HISTORY — PX: UMBILICAL HERNIA REPAIR: SHX196

## 2012-10-15 SURGERY — REPAIR, HERNIA, UMBILICAL, ADULT
Anesthesia: General | Site: Abdomen | Wound class: Clean

## 2012-10-15 MED ORDER — DEXAMETHASONE SODIUM PHOSPHATE 4 MG/ML IJ SOLN
INTRAMUSCULAR | Status: DC | PRN
  Administered 2012-10-15: 10 mg via INTRAVENOUS

## 2012-10-15 MED ORDER — HYDROCODONE-ACETAMINOPHEN 5-325 MG PO TABS: 1.0000 | ORAL_TABLET | ORAL | Status: DC | PRN

## 2012-10-15 MED ORDER — MEPERIDINE HCL 25 MG/ML IJ SOLN: 6.2500 mg | INTRAMUSCULAR | Status: DC | PRN

## 2012-10-15 MED ORDER — HYDROMORPHONE HCL PF 1 MG/ML IJ SOLN
0.2500 mg | INTRAMUSCULAR | Status: DC | PRN
  Administered 2012-10-15 (×3): 0.5 mg via INTRAVENOUS

## 2012-10-15 MED ORDER — OXYCODONE HCL 5 MG PO TABS
5.0000 mg | ORAL_TABLET | Freq: Once | ORAL | Status: DC | PRN
Start: 2012-10-15 — End: 2012-10-15

## 2012-10-15 MED ORDER — LIDOCAINE HCL (CARDIAC) 20 MG/ML IV SOLN
INTRAVENOUS | Status: DC | PRN
  Administered 2012-10-15: 100 mg via INTRAVENOUS

## 2012-10-15 MED ORDER — PROPOFOL 10 MG/ML IV BOLUS
INTRAVENOUS | Status: DC | PRN
  Administered 2012-10-15: 200 mg via INTRAVENOUS
  Administered 2012-10-15: 50 mg via INTRAVENOUS

## 2012-10-15 MED ORDER — FENTANYL CITRATE 0.05 MG/ML IJ SOLN
INTRAMUSCULAR | Status: DC | PRN
  Administered 2012-10-15 (×2): 50 ug via INTRAVENOUS

## 2012-10-15 MED ORDER — OXYCODONE HCL 5 MG/5ML PO SOLN: 5.0000 mg | Freq: Once | ORAL | Status: DC | PRN

## 2012-10-15 MED ORDER — CHLORHEXIDINE GLUCONATE 4 % EX LIQD: 1.0000 "application " | Freq: Once | CUTANEOUS | Status: DC

## 2012-10-15 MED ORDER — MIDAZOLAM HCL 5 MG/5ML IJ SOLN
INTRAMUSCULAR | Status: DC | PRN
  Administered 2012-10-15: 2 mg via INTRAVENOUS

## 2012-10-15 MED ORDER — SUCCINYLCHOLINE CHLORIDE 20 MG/ML IJ SOLN
INTRAMUSCULAR | Status: DC | PRN
  Administered 2012-10-15: 100 mg via INTRAVENOUS

## 2012-10-15 MED ORDER — BUPIVACAINE HCL (PF) 0.25 % IJ SOLN
INTRAMUSCULAR | Status: DC | PRN
  Administered 2012-10-15: 20 mL

## 2012-10-15 MED ORDER — DEXTROSE 5 % IV SOLN
3.0000 g | INTRAVENOUS | Status: AC
  Administered 2012-10-15: 3 g via INTRAVENOUS

## 2012-10-15 MED ORDER — PROMETHAZINE HCL 25 MG/ML IJ SOLN: 6.2500 mg | INTRAMUSCULAR | Status: DC | PRN

## 2012-10-15 MED ORDER — LACTATED RINGERS IV SOLN
INTRAVENOUS | Status: DC
  Administered 2012-10-15 (×2): via INTRAVENOUS

## 2012-10-15 MED ORDER — ONDANSETRON HCL 4 MG/2ML IJ SOLN
INTRAMUSCULAR | Status: DC | PRN
  Administered 2012-10-15: 4 mg via INTRAVENOUS

## 2012-10-15 SURGICAL SUPPLY — 47 items
ADH SKN CLS APL DERMABOND .7 (GAUZE/BANDAGES/DRESSINGS) ×1
APL SKNCLS STERI-STRIP NONHPOA (GAUZE/BANDAGES/DRESSINGS)
BENZOIN TINCTURE PRP APPL 2/3 (GAUZE/BANDAGES/DRESSINGS) IMPLANT
BLADE SURG 15 STRL LF DISP TIS (BLADE) ×1 IMPLANT
BLADE SURG 15 STRL SS (BLADE) ×2
BLADE SURG ROTATE 9660 (MISCELLANEOUS) IMPLANT
CHLORAPREP W/TINT 26ML (MISCELLANEOUS) ×2 IMPLANT
CLOTH BEACON ORANGE TIMEOUT ST (SAFETY) ×2 IMPLANT
COVER MAYO STAND STRL (DRAPES) ×2 IMPLANT
COVER TABLE BACK 60X90 (DRAPES) ×2 IMPLANT
DECANTER SPIKE VIAL GLASS SM (MISCELLANEOUS) ×1 IMPLANT
DERMABOND ADVANCED (GAUZE/BANDAGES/DRESSINGS) ×1
DERMABOND ADVANCED .7 DNX12 (GAUZE/BANDAGES/DRESSINGS) ×1 IMPLANT
DRAPE LAPAROTOMY TRNSV 102X78 (DRAPE) ×2 IMPLANT
DRAPE UTILITY XL STRL (DRAPES) ×2 IMPLANT
DRSG TEGADERM 4X4.75 (GAUZE/BANDAGES/DRESSINGS) IMPLANT
ELECT COATED BLADE 2.86 ST (ELECTRODE) ×2 IMPLANT
ELECT REM PT RETURN 9FT ADLT (ELECTROSURGICAL) ×2
ELECTRODE REM PT RTRN 9FT ADLT (ELECTROSURGICAL) ×1 IMPLANT
GAUZE SPONGE 4X4 12PLY STRL LF (GAUZE/BANDAGES/DRESSINGS) IMPLANT
GLOVE BIO SURGEON STRL SZ7.5 (GLOVE) ×3 IMPLANT
GLOVE BIOGEL PI IND STRL 8 (GLOVE) IMPLANT
GLOVE BIOGEL PI INDICATOR 8 (GLOVE) ×1
GOWN PREVENTION PLUS XLARGE (GOWN DISPOSABLE) ×2 IMPLANT
NDL HYPO 25X1 1.5 SAFETY (NEEDLE) ×1 IMPLANT
NEEDLE HYPO 22GX1.5 SAFETY (NEEDLE) IMPLANT
NEEDLE HYPO 25X1 1.5 SAFETY (NEEDLE) ×2 IMPLANT
NS IRRIG 1000ML POUR BTL (IV SOLUTION) IMPLANT
PACK BASIN DAY SURGERY FS (CUSTOM PROCEDURE TRAY) ×2 IMPLANT
PATCH VENTRAL SMALL 4.3 (Mesh Specialty) ×1 IMPLANT
PENCIL BUTTON HOLSTER BLD 10FT (ELECTRODE) ×2 IMPLANT
SLEEVE SCD COMPRESS KNEE MED (MISCELLANEOUS) ×1 IMPLANT
SPONGE LAP 18X18 X RAY DECT (DISPOSABLE) ×2 IMPLANT
STRIP CLOSURE SKIN 1/2X4 (GAUZE/BANDAGES/DRESSINGS) IMPLANT
SUT MON AB 4-0 PC3 18 (SUTURE) ×2 IMPLANT
SUT NOVA NAB DX-16 0-1 5-0 T12 (SUTURE) ×2 IMPLANT
SUT SILK 3 0 SH 30 (SUTURE) IMPLANT
SUT VIC AB 2-0 SH 27 (SUTURE) ×2
SUT VIC AB 2-0 SH 27XBRD (SUTURE) ×1 IMPLANT
SUT VIC AB 3-0 54X BRD REEL (SUTURE) IMPLANT
SUT VIC AB 3-0 BRD 54 (SUTURE)
SUT VIC AB 3-0 SH 27 (SUTURE)
SUT VIC AB 3-0 SH 27X BRD (SUTURE) IMPLANT
SYR CONTROL 10ML LL (SYRINGE) ×2 IMPLANT
TOWEL OR 17X24 6PK STRL BLUE (TOWEL DISPOSABLE) ×4 IMPLANT
TOWEL OR NON WOVEN STRL DISP B (DISPOSABLE) ×2 IMPLANT
WATER STERILE IRR 1000ML POUR (IV SOLUTION) ×1 IMPLANT

## 2012-10-15 NOTE — Anesthesia Preprocedure Evaluation (Signed)
Anesthesia Evaluation  Patient identified by MRN, date of birth, ID band Patient awake    Reviewed: Allergy & Precautions, H&P , NPO status , Patient's Chart, lab work & pertinent test results  History of Anesthesia Complications Negative for: history of anesthetic complications  Airway Mallampati: II  Neck ROM: Full    Dental  (+) Teeth Intact   Pulmonary neg pulmonary ROS,  breath sounds clear to auscultation        Cardiovascular hypertension, Rhythm:Regular Rate:Normal     Neuro/Psych negative neurological ROS     GI/Hepatic negative GI ROS, Neg liver ROS,   Endo/Other  Morbid obesity  Renal/GU negative Renal ROS     Musculoskeletal   Abdominal (+) + obese,   Peds  Hematology negative hematology ROS (+)   Anesthesia Other Findings   Reproductive/Obstetrics                           Anesthesia Physical Anesthesia Plan  ASA: II  Anesthesia Plan: General   Post-op Pain Management:    Induction: Intravenous  Airway Management Planned: LMA and Oral ETT  Additional Equipment:   Intra-op Plan:   Post-operative Plan: Extubation in OR  Informed Consent: I have reviewed the patients History and Physical, chart, labs and discussed the procedure including the risks, benefits and alternatives for the proposed anesthesia with the patient or authorized representative who has indicated his/her understanding and acceptance.   Dental advisory given  Plan Discussed with: CRNA and Surgeon  Anesthesia Plan Comments:         Anesthesia Quick Evaluation

## 2012-10-15 NOTE — Op Note (Signed)
10/15/2012  12:25 PM  PATIENT:  Debbie Gibson  36 y.o. female  PRE-OPERATIVE DIAGNOSIS:  umbilical hernia  POST-OPERATIVE DIAGNOSIS:  umbilical hernia  PROCEDURE:  Procedure(s) (LRB) with comments: HERNIA REPAIR UMBILICAL ADULT (N/A) - umbilical hernia repair with mesh INSERTION OF MESH (N/A)  SURGEON:  Surgeon(s) and Role:    * Robyne Askew, MD - Primary  PHYSICIAN ASSISTANT:   ASSISTANTS: none   ANESTHESIA:   general  EBL:  Total I/O In: 1000 [I.V.:1000] Out: -   BLOOD ADMINISTERED:none  DRAINS: none   LOCAL MEDICATIONS USED:  MARCAINE     SPECIMEN:  No Specimen  DISPOSITION OF SPECIMEN:  N/A  COUNTS:  YES  TOURNIQUET:  * No tourniquets in log *  DICTATION: .Dragon Dictation After informed consent was obtained the patient was brought to the operating room and placed in the supine position on the operating room table. After adequate induction of general anesthesia the patient's abdomen was prepped with ChloraPrep, allowed to dry, and draped in the usual sterile manner. A curvilinear transversely oriented incision was made at the inferior area to the umbilicus with a 15 blade knife. This incision was carried through the skin and subcutaneous tissue sharply with electrocautery. The hernia was readily identified. The hernia sac was opened sharply with the electrocautery. There was only some preperitoneal fat within the hernia. At the fascial level there were 2 small adjacent defects. These were joined sharply with the electrocautery. Once this was accomplished we could get a finger through the fascial defect. We had not violated the peritoneum and were in the preperitoneal space. A small Ethicon umbilical patch was chosen. The mesh was placed through the fascial defect and tension was held on the tails to keep it held up against the abdominal wall snugly. The mesh was in good position circumferentially. The fascial defect was then closed with interrupted #1 Novafil  stitches incorporating the tails of the mesh. The excess tail was trimmed away. Once this was accomplished the hernia appeared to be well repaired. The wound was then irrigated with copious amounts of saline and infiltrated with more quarter percent Marcaine. The deep layer the wound was then closed with interrupted 3-0 Vicryl stitches. The skin was closed with interrupted 4-0 Monocryl subcuticular stitches. Dermabond dressings were applied. The patient tolerated the procedure well. At the end of the case on needle sponge and instrument counts were correct. The patient was then awakened and taken to recovery in stable condition.  PLAN OF CARE: Discharge to home after PACU  PATIENT DISPOSITION:  PACU - hemodynamically stable.   Delay start of Pharmacological VTE agent (>24hrs) due to surgical blood loss or risk of bleeding: not applicable

## 2012-10-15 NOTE — Interval H&P Note (Signed)
History and Physical Interval Note:  10/15/2012 11:08 AM  Debbie Gibson  has presented today for surgery, with the diagnosis of umbilical hernia  The various methods of treatment have been discussed with the patient and family. After consideration of risks, benefits and other options for treatment, the patient has consented to  Procedure(s) (LRB) with comments: HERNIA REPAIR UMBILICAL ADULT (N/A) - umbilical hernia repair with mesh INSERTION OF MESH (N/A) as a surgical intervention .  The patient's history has been reviewed, patient examined, no change in status, stable for surgery.  I have reviewed the patient's chart and labs.  Questions were answered to the patient's satisfaction.     TOTH III,Taysen Bushart S

## 2012-10-15 NOTE — H&P (View-Only) (Signed)
Subjective:     Patient ID: Debbie Gibson, female   DOB: 05-08-1976, 36 y.o.   MRN: [id number]  HPI We're asked to see the patient in consultation by Dr. Lavella Lemons to evaluate her for an umbilical hernia. The patient is a 36 year old black female who began experiencing sharp pain just to the left of her umbilicus a couple weeks ago. The pain was severe enough that she went to the emergency department. A CT scan was obtained that showed a small umbilical hernia but there was no involvement of the intestines. She has had some nausea but no vomiting. She denies any fevers or chills.  Review of Systems  Constitutional: Negative.   HENT: Negative.   Eyes: Negative.   Respiratory: Negative.   Cardiovascular: Negative.   Gastrointestinal: Negative.   Genitourinary: Negative.   Musculoskeletal: Negative.   Skin: Negative.   Neurological: Negative.   Hematological: Negative.   Psychiatric/Behavioral: Negative.        Objective:   Physical Exam  Constitutional: She is oriented to person, place, and time. She appears well-developed and well-nourished.  HENT:  Head: Normocephalic and atraumatic.  Eyes: Conjunctivae normal and EOM are normal. Pupils are equal, round, and reactive to light.  Neck: Normal range of motion. Neck supple.  Cardiovascular: Normal rate, regular rhythm and normal heart sounds.   Pulmonary/Chest: Effort normal and breath sounds normal.  Abdominal: Soft. Bowel sounds are normal.       There is a small umbilical hernia that can be appreciated.  Musculoskeletal: Normal range of motion.  Neurological: She is alert and oriented to person, place, and time.  Skin: Skin is warm and dry.  Psychiatric: She has a normal mood and affect. Her behavior is normal.       Assessment:     The patient has a small but symptomatic umbilical hernia. Because of the risk of incarceration stimulation at think she would benefit from having this fixed. She would also like to have this  done. I have discussed with her in detail the risks and benefits of the operation to fix the hernia as well as some of the technical aspects including the possibility of using mesh and she understands and wishes to proceed.    Plan:     Plan for umbilical hernia repair with mesh

## 2012-10-15 NOTE — Anesthesia Procedure Notes (Signed)
Procedure Name: Intubation Date/Time: 10/15/2012 11:35 AM Performed by: Gar Gibbon Pre-anesthesia Checklist: Patient identified, Emergency Drugs available, Suction available and Patient being monitored Patient Re-evaluated:Patient Re-evaluated prior to inductionOxygen Delivery Method: Circle System Utilized Preoxygenation: Pre-oxygenation with 100% oxygen Intubation Type: IV induction Ventilation: Mask ventilation without difficulty Laryngoscope Size: Miller and 3 Grade View: Grade II Tube type: Oral Tube size: 7.0 mm Number of attempts: 1 Airway Equipment and Method: stylet and oral airway Placement Confirmation: ETT inserted through vocal cords under direct vision,  positive ETCO2 and breath sounds checked- equal and bilateral Secured at: 22 cm Tube secured with: Tape Dental Injury: Teeth and Oropharynx as per pre-operative assessment

## 2012-10-15 NOTE — Transfer of Care (Signed)
Immediate Anesthesia Transfer of Care Note  Patient: Debbie Gibson  Procedure(s) Performed: Procedure(s) (LRB) with comments: HERNIA REPAIR UMBILICAL ADULT (N/A) - umbilical hernia repair with mesh INSERTION OF MESH (N/A)  Patient Location: PACU  Anesthesia Type:General  Level of Consciousness: sedated and patient cooperative  Airway & Oxygen Therapy: Patient Spontanous Breathing and Patient connected to face mask oxygen  Post-op Assessment: Report given to PACU RN and Post -op Vital signs reviewed and stable  Post vital signs: Reviewed and stable  Complications: No apparent anesthesia complications

## 2012-10-16 ENCOUNTER — Encounter (HOSPITAL_BASED_OUTPATIENT_CLINIC_OR_DEPARTMENT_OTHER): Payer: Self-pay | Admitting: General Surgery

## 2012-10-16 LAB — POCT HEMOGLOBIN-HEMACUE: Hemoglobin: 12.5 g/dL (ref 12.0–15.0)

## 2012-10-16 NOTE — Anesthesia Postprocedure Evaluation (Signed)
  Anesthesia Post-op Note  Patient: Debbie Gibson  Procedure(s) Performed: Procedure(s) (LRB) with comments: HERNIA REPAIR UMBILICAL ADULT (N/A) - umbilical hernia repair with mesh INSERTION OF MESH (N/A)  Patient Location: PACU  Anesthesia Type:General  Level of Consciousness: awake  Airway and Oxygen Therapy: Patient Spontanous Breathing  Post-op Pain: mild  Post-op Assessment: Post-op Vital signs reviewed  Post-op Vital Signs: stable  Complications: No apparent anesthesia complications

## 2012-10-28 ENCOUNTER — Ambulatory Visit: Payer: Managed Care, Other (non HMO) | Admitting: Family Medicine

## 2012-10-29 ENCOUNTER — Ambulatory Visit (INDEPENDENT_AMBULATORY_CARE_PROVIDER_SITE_OTHER): Payer: Managed Care, Other (non HMO) | Admitting: General Surgery

## 2012-10-29 ENCOUNTER — Encounter (INDEPENDENT_AMBULATORY_CARE_PROVIDER_SITE_OTHER): Payer: Self-pay | Admitting: General Surgery

## 2012-10-29 VITALS — BP 142/88 | HR 76 | Temp 97.4°F | Resp 16 | Ht 69.0 in | Wt 253.6 lb

## 2012-10-29 DIAGNOSIS — K429 Umbilical hernia without obstruction or gangrene: Secondary | ICD-10-CM

## 2012-10-29 NOTE — Patient Instructions (Signed)
No heavy lifting 

## 2012-10-29 NOTE — Progress Notes (Signed)
Subjective:     Patient ID: Debbie Gibson, female   DOB: 11/25/1975, 36 y.o.   MRN: [id number]  HPI The patient is a 36 year old black female who is about 2 weeks status post umbilical hernia repair with mesh. She's doing well and has no complaints today. She denies any abdominal pain. Her appetite is good breast working normally.  Review of Systems     Objective:   Physical Exam On exam her abdomen is soft and nontender. Her incision is healing nicely with no sign of infection. There is no palpable evidence for recurrence of the hernia.    Assessment:     2 weeks status post umbilical hernia repair with mesh    Plan:     At this point I would like her to refrain from any heavy lifting for another 4 weeks. We will plan to see her back at the end of that time to check her progress.

## 2012-12-04 ENCOUNTER — Encounter (INDEPENDENT_AMBULATORY_CARE_PROVIDER_SITE_OTHER): Payer: Self-pay | Admitting: General Surgery

## 2012-12-04 ENCOUNTER — Ambulatory Visit (INDEPENDENT_AMBULATORY_CARE_PROVIDER_SITE_OTHER): Payer: Managed Care, Other (non HMO) | Admitting: General Surgery

## 2012-12-04 VITALS — BP 138/84 | HR 72 | Temp 97.1°F | Resp 16 | Ht 69.0 in | Wt 255.2 lb

## 2012-12-04 DIAGNOSIS — K429 Umbilical hernia without obstruction or gangrene: Secondary | ICD-10-CM

## 2012-12-04 NOTE — Patient Instructions (Signed)
May return to all normal activities 

## 2012-12-04 NOTE — Progress Notes (Signed)
Subjective:     Patient ID: Debbie Gibson, female   DOB: 05-11-1976, 37 y.o.   MRN: [id number]  HPI The patient is a 37 year old black female who is about 6 weeks status post umbilical hernia repair with mesh. She has done very well. She denies any abdominal pain. Her appetite is good and her bowels are working normally.  Review of Systems     Objective:   Physical Exam On exam her abdomen is soft and nontender. Her incision has healed nicely with no sign of infection. There is no palpable evidence for recurrence of the hernia.    Assessment:     6 weeks status post umbilical hernia repair with mesh    Plan:     At this point I think she can return to all her normal activities without any restrictions. We will plan to see her back on a when necessary basis.

## 2012-12-05 ENCOUNTER — Other Ambulatory Visit: Payer: Self-pay | Admitting: Family Medicine

## 2012-12-22 ENCOUNTER — Other Ambulatory Visit: Payer: Self-pay | Admitting: Family Medicine

## 2012-12-22 ENCOUNTER — Other Ambulatory Visit: Payer: Self-pay

## 2012-12-22 MED ORDER — LISINOPRIL-HYDROCHLOROTHIAZIDE 20-25 MG PO TABS: 1.0000 | ORAL_TABLET | Freq: Every day | ORAL | Status: DC

## 2012-12-22 NOTE — Telephone Encounter (Signed)
Needs refill on lisinopril-HCTZ   (has refills on amlodipine)  [facility]  [address]/[place]

## 2012-12-22 NOTE — Telephone Encounter (Signed)
She needs a followup visit on her blood pressure. Don't let her on out

## 2012-12-23 ENCOUNTER — Other Ambulatory Visit: Payer: Self-pay

## 2012-12-23 MED ORDER — LISINOPRIL-HYDROCHLOROTHIAZIDE 20-25 MG PO TABS: 1.0000 | ORAL_TABLET | Freq: Every day | ORAL | Status: DC

## 2012-12-23 NOTE — Telephone Encounter (Signed)
SENT B/P MED IN

## 2012-12-31 ENCOUNTER — Encounter: Payer: Self-pay | Admitting: Medical

## 2012-12-31 ENCOUNTER — Ambulatory Visit (INDEPENDENT_AMBULATORY_CARE_PROVIDER_SITE_OTHER): Payer: Managed Care, Other (non HMO) | Admitting: Medical

## 2012-12-31 VITALS — BP 130/90 | HR 72 | Temp 98.2°F | Resp 16 | Wt 256.0 lb

## 2012-12-31 DIAGNOSIS — J019 Acute sinusitis, unspecified: Secondary | ICD-10-CM

## 2012-12-31 MED ORDER — AMOXICILLIN 875 MG PO TABS: 875.0000 mg | ORAL_TABLET | Freq: Two times a day (BID) | ORAL | Status: DC

## 2012-12-31 NOTE — Progress Notes (Signed)
Subjective:  Debbie Gibson is a 37 y.o. female who presents for possible sinus infection.  She notes several day hx/o sinus pressure, worse behind nose, left face, some sore throat, post nasal drainage, headache.  Denies fever, NVD, cough, chest congestion, no ear pain.   Past history is significant for hx/o sinus infection about yearly in the fall. Patient is a non-smoker.  Using OTC decongestant for symptoms.  Denies sick contacts.  No other aggravating or relieving factors.  No other c/o.   Past Medical History  Diagnosis Date  . Umbilical hernia 10/2012  . Hypertension     under control with meds., has been on med. x 2 yr.    Objective: Filed Vitals:   12/31/12 1207  BP: 130/90  Pulse: 72  Temp: 98.2 F (36.8 C)  Resp: 16    General appearance: Alert, WD/WN, no distress                             Skin: warm, no rash                           Head: +left maxillary sinus tenderness,                            Eyes: conjunctiva normal, corneas clear, PERRLA                            Ears: left TM with fluid behind TM, but right pearly TMs, external ear canals normal                          Nose: septum midline, turbinates swollen, with erythema and clear discharge             Mouth/throat: MMM, tongue normal, mild pharyngeal erythema                           Neck: supple, no adenopathy, no thyromegaly, nontender                          Heart: RRR, normal S1, S2, no murmurs                         Lungs: CTA bilaterally, no wheezes, rales, or rhonchi      Assessment and Plan:   Encounter Diagnosis  Name Primary?  . Acute sinusitis Yes   Discussed symptomatic relief, nasal saline, short term Afrin, and if persistent facial pain, teeth pain begin Amoxicillin,but try symptomatic strategy for next several days first.

## 2012-12-31 NOTE — Patient Instructions (Signed)
Salt water nasal flush, salt water gargles, Mucinex plain or DM OTC, increase water intake, rest, and short term Afrin OTC at bedtime for nasal congestion.    If worsening, begin Amoxicillin.

## 2013-09-03 ENCOUNTER — Encounter: Payer: Self-pay | Admitting: Family Medicine

## 2013-09-03 ENCOUNTER — Ambulatory Visit (INDEPENDENT_AMBULATORY_CARE_PROVIDER_SITE_OTHER): Payer: Managed Care, Other (non HMO) | Admitting: Family Medicine

## 2013-09-03 VITALS — BP 138/100 | HR 76 | Ht 68.0 in | Wt 257.0 lb

## 2013-09-03 DIAGNOSIS — M542 Cervicalgia: Secondary | ICD-10-CM

## 2013-09-03 DIAGNOSIS — M549 Dorsalgia, unspecified: Secondary | ICD-10-CM

## 2013-09-03 DIAGNOSIS — I10 Essential (primary) hypertension: Secondary | ICD-10-CM

## 2013-09-03 DIAGNOSIS — Z23 Encounter for immunization: Secondary | ICD-10-CM

## 2013-09-03 DIAGNOSIS — M62838 Other muscle spasm: Secondary | ICD-10-CM

## 2013-09-03 DIAGNOSIS — J309 Allergic rhinitis, unspecified: Secondary | ICD-10-CM

## 2013-09-03 MED ORDER — KETOROLAC TROMETHAMINE 60 MG/2ML IM SOLN
60.0000 mg | Freq: Once | INTRAMUSCULAR | Status: AC
  Administered 2013-09-03: 60 mg via INTRAMUSCULAR

## 2013-09-03 MED ORDER — FLUTICASONE PROPIONATE 50 MCG/ACT NA SUSP: 2.0000 | Freq: Every day | NASAL | Status: DC

## 2013-09-03 MED ORDER — CYCLOBENZAPRINE HCL 10 MG PO TABS: 5.0000 mg | ORAL_TABLET | Freq: Three times a day (TID) | ORAL | Status: DC | PRN

## 2013-09-03 MED ORDER — NAPROXEN 500 MG PO TABS: 500.0000 mg | ORAL_TABLET | Freq: Two times a day (BID) | ORAL | Status: DC

## 2013-09-03 NOTE — Patient Instructions (Signed)
  Naproxen twice daily with food until resolved. Flexeril at bedtime Neck stretches shown.  Continue heat, massage.  Wait 6 hours after toradol injection before starting the naproxen. Stop using the Aleve PM while on these other medications.  Use the Flonase every day

## 2013-09-03 NOTE — Progress Notes (Signed)
Chief Complaint  Patient presents with  . Fall    this past Saturday, tumbled down a few stairs and has had a HA ever since, pain shooting up her neck to head. Since the fall her bp has been elevated.    She fell down the last 4 steps at her house 5 days ago.  She landed on her bottom, but her back hit the step behind her.  Feet went out from under her.  Her left shoulder also hit the staircase during the fall.  Having ongoing pain in the lower back, but also having a sharp pain that starts at her left shoulder, and comes up the back of her neck.  Having pain in the back of her head, but it radiates around to her temples.  She has taken Aleve PM--it helps her sleep.  Hasn't taken any daytime medication.  Hot showers help, as does the [employer] (just using at night, too busy during the day).  She has had some shooting pains down the left arm, and into the pinkie.  Denies any weakness in the left arm/hand.  Currently having 10/10 pain in her neck/shoulder.  Blood pressures have been running 127/88 (120's/80's).  Since she fell and in pain, her BP's have been higher, 160/100.  Allergies--not well controlled at night.  She is taking zyrtec daily, but only using the flonase prn, about once a week.  Past Medical History  Diagnosis Date  . Umbilical hernia 10/2012  . Hypertension     under control with meds., has been on med. x 2 yr.   Past Surgical History  Procedure Laterality Date  . Wisdom tooth extraction      as a teenager  . Umbilical hernia repair  10/15/2012    Procedure: HERNIA REPAIR UMBILICAL ADULT;  Surgeon: Robyne Askew, MD;  Location: [facility];  Service: General;  Laterality: N/A;  umbilical hernia repair with mesh  . Insertion of mesh  10/15/2012    Procedure: INSERTION OF MESH;  Surgeon: Robyne Askew, MD;  Location: [facility];  Service: General;  Laterality: N/A;   History   Social History  . Marital Status: Married    Spouse Name: N/A     Number of Children: 2  . Years of Education: N/A   Occupational History  . works at [employer], Teacher, music [employer]   Social History Main Topics  . Smoking status: Never Smoker   . Smokeless tobacco: Never Used  . Alcohol Use: No  . Drug Use: No  . Sexual Activity: Yes    Birth Control/ Protection: Condom     Comment: denies pregnancy now   Other Topics Concern  . Not on file   Social History Narrative   Lives with husband and 2 kids    Current outpatient prescriptions:amLODipine (NORVASC) 5 MG tablet, Take 1 tablet (5 mg total) by mouth daily., Disp: 30 tablet, Rfl: 11;  cetirizine (ZYRTEC) 10 MG tablet, Take 10 mg by mouth daily., Disp: , Rfl: ;  lisinopril-hydrochlorothiazide (PRINZIDE,ZESTORETIC) 20-25 MG per tablet, Take 1 tablet by mouth daily., Disp: 30 tablet, Rfl: 0;  Naproxen Sodium (ALEVE PO), Take 1 tablet by mouth daily., Disp: , Rfl:  fluticasone (FLONASE) 50 MCG/ACT nasal spray, Place 2 sprays into the nose daily., Disp: 16 g, Rfl: 11  No Known Allergies  ROS:  Denies fevers, chills, dizziness, vision changes, URI symptoms, cough, shortness of breath, chest pain, nausea, vomiting, bowel changes, numbness,  tingling or weakness.  No rashes, bleeding or bruising.  No urinary complaints.  PHYSICAL EXAM: BP 150/98  Pulse 76  Ht 5\' 8"  (1.727 m)  Wt 257 lb (116.574 kg)  BMI 39.09 kg/m2  LMP 08/15/2013 138/100 on repeat by MD, RA Well developed, pleasant female in no distress Head: atraumatic. No soft tissue swelling or bruising, nontender Neck: no spinal tenderness.  +tenderness and significant spasm of L trapezius muscle.  nontender on right Back: mildly tender across entire lower back, and most of lower lumbar spine down to coccyx.  No bruising or swelling noted.  No CVA tenderness Neuro:  Alert and oriented.  Cranial nerves intact. Normal strength, sensation, DTR's 2+ and symmetric in UE's.  Normal gait Extremities: no edema Psych: normal mood,  affect Skin: no bleeding/bruising, rashes, lesions  ASSESSMENT/PLAN:  Muscle spasms of neck - Plan: cyclobenzaprine (FLEXERIL) 10 MG tablet, ketorolac (TORADOL) injection 60 mg  Essential hypertension, benign  Need for prophylactic vaccination and inoculation against influenza - Plan: Flu Vaccine QUAD 36+ mos PF IM (Fluarix)  Neck pain - Plan: naproxen (NAPROSYN) 500 MG tablet, ketorolac (TORADOL) injection 60 mg  Back pain - Plan: naproxen (NAPROSYN) 500 MG tablet, ketorolac (TORADOL) injection 60 mg  Allergic rhinitis, cause unspecified - Plan: fluticasone (FLONASE) 50 MCG/ACT nasal spray  Trapezius spasm s/p fall.   Naproxen twice daily with food until resolved. NSAID risks, side effects, precautions reviewed. Flexeril at bedtime prn muscle spasm.  Risks/side effects reviewed. Neck stretches shown.  Continue heat, massage.  Wait 6 hours after toradol injection before starting the naproxen. Stop using the Aleve PM while on these other medications.  Allergies--suboptimally controlled.  Encouraged daily use of flonase (as well as daily zyrtec).  The zyrtec can be changed to prn if well controlled, but keep the flonase use daily.  May use both every day, if needed.  Hypertension--elevated today due to pain.  Otherwise sounds well controlled.  Continue current medications and monitoring regularly.  Due for med check and labs--to schedule with Dr. Susann Givens. Hopefully BP's will be improved by then, as pain resolves.

## 2013-09-25 ENCOUNTER — Ambulatory Visit (INDEPENDENT_AMBULATORY_CARE_PROVIDER_SITE_OTHER): Payer: Managed Care, Other (non HMO) | Admitting: Family Medicine

## 2013-09-25 ENCOUNTER — Encounter: Payer: Self-pay | Admitting: Family Medicine

## 2013-09-25 VITALS — BP 130/80 | Wt 256.0 lb

## 2013-09-25 DIAGNOSIS — Z79899 Other long term (current) drug therapy: Secondary | ICD-10-CM

## 2013-09-25 DIAGNOSIS — J301 Allergic rhinitis due to pollen: Secondary | ICD-10-CM

## 2013-09-25 DIAGNOSIS — I10 Essential (primary) hypertension: Secondary | ICD-10-CM

## 2013-09-25 DIAGNOSIS — E669 Obesity, unspecified: Secondary | ICD-10-CM

## 2013-09-25 LAB — CBC WITH DIFFERENTIAL/PLATELET
Basophils Relative: 1 % (ref 0–1)
Eosinophils Absolute: 0.1 10*3/uL (ref 0.0–0.7)
MCH: 26.6 pg (ref 26.0–34.0)
MCHC: 33.7 g/dL (ref 30.0–36.0)
Neutrophils Relative %: 44 % (ref 43–77)
Platelets: 275 10*3/uL (ref 150–400)

## 2013-09-25 LAB — LIPID PANEL
LDL Cholesterol: 151 mg/dL — ABNORMAL HIGH (ref 0–99)
Total CHOL/HDL Ratio: 3.9 Ratio
Triglycerides: 68 mg/dL (ref <150)
VLDL: 14 mg/dL (ref 0–40)

## 2013-09-25 LAB — COMPREHENSIVE METABOLIC PANEL
ALT: 10 U/L (ref 0–35)
Alkaline Phosphatase: 78 U/L (ref 39–117)
Creat: 0.84 mg/dL (ref 0.50–1.10)
Glucose, Bld: 91 mg/dL (ref 70–99)
Sodium: 139 mEq/L (ref 135–145)
Total Bilirubin: 0.4 mg/dL (ref 0.3–1.2)
Total Protein: 8.2 g/dL (ref 6.0–8.3)

## 2013-09-25 MED ORDER — FLUTICASONE PROPIONATE 50 MCG/ACT NA SUSP: 2.0000 | Freq: Every day | NASAL | Status: DC

## 2013-09-25 MED ORDER — AMLODIPINE BESYLATE 5 MG PO TABS: 5.0000 mg | ORAL_TABLET | Freq: Every day | ORAL | Status: DC

## 2013-09-25 MED ORDER — LISINOPRIL-HYDROCHLOROTHIAZIDE 20-25 MG PO TABS: 1.0000 | ORAL_TABLET | Freq: Every day | ORAL | Status: DC

## 2013-09-25 NOTE — Progress Notes (Signed)
  Subjective:    Patient ID: Debbie Gibson, female    DOB: 07/08/1976, 37 y.o.   MRN: [id number]  HPI She is here for medication check. She recently fell and is still having some neck discomfort with radiation down her arm into the fourth and fifth fingers. She states she is roughly 80% better. She continues on other medications listed in the chart. Her allergies are under good control. She continues on her blood pressure medication. She has made attempts in the past lose weight but has been so far unsuccessful. She works, has 5 children and is in school to become a Runner, broadcasting/film/video.   Review of Systems     Objective:   Physical Exam alert and in no distress. Tympanic membranes and canals are normal. Throat is clear. Tonsils are normal. Neck is supple without adenopathy or thyromegaly. Cardiac exam shows a regular sinus rhythm without murmurs or gallops. Lungs are clear to auscultation.        Assessment & Plan:  Essential hypertension, benign - Plan: lisinopril-hydrochlorothiazide (PRINZIDE,ZESTORETIC) 20-25 MG per tablet  Allergic rhinitis due to pollen - Plan: fluticasone (FLONASE) 50 MCG/ACT nasal spray  Obesity (BMI 30-39.9) - Plan: CBC with Differential, Comprehensive metabolic panel, Lipid panel  Encounter for long-term (current) use of other medications - Plan: CBC with Differential, Comprehensive metabolic panel, Lipid panel  Hypertension - Plan: amLODipine (NORVASC) 5 MG tablet, CBC with Differential, Comprehensive metabolic panel  She will continue on her present medications. Discussed weight loss with her in regard to increasing her physical activity and cutting back on carbohydrates.

## 2013-09-25 NOTE — Patient Instructions (Addendum)
Heat for 20 minutes 3 times a day and stretching. If it does not continue to improve make another appointment Eat less and move more. Cut back on white food. Red, rice, possibly, potatoes, sugar

## 2013-09-28 NOTE — Progress Notes (Signed)
Quick Note:  Mailed pt letter of results with diet info. ______

## 2014-03-02 ENCOUNTER — Ambulatory Visit (INDEPENDENT_AMBULATORY_CARE_PROVIDER_SITE_OTHER): Payer: Managed Care, Other (non HMO) | Admitting: Family Medicine

## 2014-03-02 ENCOUNTER — Encounter: Payer: Self-pay | Admitting: Family Medicine

## 2014-03-02 VITALS — BP 150/94 | HR 60 | Ht 69.0 in | Wt 253.0 lb

## 2014-03-02 DIAGNOSIS — I10 Essential (primary) hypertension: Secondary | ICD-10-CM

## 2014-03-02 DIAGNOSIS — G44209 Tension-type headache, unspecified, not intractable: Secondary | ICD-10-CM

## 2014-03-02 DIAGNOSIS — E669 Obesity, unspecified: Secondary | ICD-10-CM

## 2014-03-02 DIAGNOSIS — Z0000 Encounter for general adult medical examination without abnormal findings: Secondary | ICD-10-CM

## 2014-03-02 DIAGNOSIS — J301 Allergic rhinitis due to pollen: Secondary | ICD-10-CM

## 2014-03-02 MED ORDER — AMLODIPINE BESYLATE 5 MG PO TABS: 5.0000 mg | ORAL_TABLET | Freq: Every day | ORAL | Status: DC

## 2014-03-02 MED ORDER — LISINOPRIL-HYDROCHLOROTHIAZIDE 20-25 MG PO TABS: 1.0000 | ORAL_TABLET | Freq: Every day | ORAL | Status: DC

## 2014-03-02 MED ORDER — FLUTICASONE PROPIONATE 50 MCG/ACT NA SUSP: 2.0000 | Freq: Every day | NASAL | Status: AC

## 2014-03-02 NOTE — Progress Notes (Signed)
Subjective:    Patient ID: Debbie Gibson, female    DOB: 15-Mar-1976, 38 y.o.   MRN: [id number]  HPI She is here for a complete examination. She has had difficulty with right shoulder pain especially with certain motions. She dates this to when she injured her shoulder back in the fall. She also complains of daily headaches as starting in the occipital area and moved to the forehead. She describes him as a tight feeling. They usually occur in the mid morning. She does intermittently use Aleve for this. She is doing well on Flonase to help with her allergies. She continues on blood pressure medications. She is quite busy with school and work. 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 Her last Pap was approximately 2 years ago.   Review of Systems  All other systems reviewed and are negative.      Objective:   Physical Exam BP 150/94  Pulse 60  Ht 5\' 9"  (1.753 m)  Wt 253 lb (114.76 kg)  BMI 37.34 kg/m2  General Appearance:    Alert, cooperative, no distress, appears stated age  Head:    Normocephalic, without obvious abnormality, atraumatic  Eyes:    PERRL, conjunctiva/corneas clear, EOM's intact, fundi    benign  Ears:    Normal TM's and external ear canals  Nose:   Nares normal, mucosa normal, no drainage or sinus   tenderness  Throat:   Lips, mucosa, and tongue normal; teeth and gums normal  Neck:   Supple, no lymphadenopathy;  thyroid:  no   enlargement/tenderness/nodules; no carotid   bruit or JVD  Back:    Spine nontender, no curvature, ROM normal, no CVA     tenderness  Lungs:     Clear to auscultation bilaterally without wheezes, rales or     ronchi; respirations unlabored  Chest Wall:    No tenderness or deformity   Heart:    Regular rate and rhythm, S1 and S2 normal, no murmur, rub   or gallop  Breast Exam:    Deferred to GYN  Abdomen:     Soft, non-tender, nondistended, normoactive bowel sounds,    no masses, no hepatosplenomegaly  Genitalia:     Deferred to GYN     Extremities:   No clubbing, cyanosis or edema  Pulses:   2+ and symmetric all extremities  Skin:   Skin color, texture, turgor normal, no rashes or lesions  Lymph nodes:   Cervical, supraclavicular, and axillary nodes normal  Neurologic:   CNII-XII intact, normal strength, sensation and gait; reflexes 2+ and symmetric throughout          Psych:   Normal mood, affect, hygiene and grooming.          Assessment & Plan:  Routine general medical examination at a health care facility  Tension headache  Obesity (BMI 30-39.9)  Allergic rhinitis due to pollen - Plan: fluticasone (FLONASE) 50 MCG/ACT nasal spray  Essential hypertension, benign - Plan: lisinopril-hydrochlorothiazide (PRINZIDE,ZESTORETIC) 20-25 MG per tablet  Hypertension - Plan: amLODipine (NORVASC) 5 MG tablet Shoulder exam was not done. We got side tracked. She will come back for reevaluation of the shoulder. Also discussed tension headaches and their treatment. Did recommend heat and stretching. She is also to pay attention to what seems to precipitate  her headaches. Continue on her allergy medications. We briefly discussed weight reduction however she is quite busy with her present work and school schedule. We will readdress this at  a later date.

## 2014-03-02 NOTE — Patient Instructions (Signed)

## 2014-03-03 ENCOUNTER — Other Ambulatory Visit (INDEPENDENT_AMBULATORY_CARE_PROVIDER_SITE_OTHER): Payer: Managed Care, Other (non HMO)

## 2014-03-03 DIAGNOSIS — Z111 Encounter for screening for respiratory tuberculosis: Secondary | ICD-10-CM

## 2014-03-04 ENCOUNTER — Ambulatory Visit (INDEPENDENT_AMBULATORY_CARE_PROVIDER_SITE_OTHER): Payer: Managed Care, Other (non HMO) | Admitting: Family Medicine

## 2014-03-04 ENCOUNTER — Encounter: Payer: Self-pay | Admitting: Family Medicine

## 2014-03-04 VITALS — BP 124/80 | HR 72 | Wt 254.0 lb

## 2014-03-04 DIAGNOSIS — M25519 Pain in unspecified shoulder: Secondary | ICD-10-CM

## 2014-03-04 MED ORDER — TRIAMCINOLONE ACETONIDE 40 MG/ML IJ SUSP
40.0000 mg | Freq: Once | INTRAMUSCULAR | Status: AC
  Administered 2014-03-04: 40 mg via INTRAMUSCULAR

## 2014-03-04 MED ORDER — SODIUM CHLORIDE 0.45 % IV SOLN
10.0000 mL/h | Freq: Once | INTRAVENOUS | Status: AC
  Administered 2014-03-04: 10 mL/h

## 2014-03-04 NOTE — Progress Notes (Signed)
   Subjective:    Patient ID: Debbie Gibson, female    DOB: 1976-04-30, 38 y.o.   MRN: [id number]  HPI She is here for evaluation of right shoulder pain. She fell in October landing on her back but experienced left and right shoulder pain. The left shoulder pain has gone away completely. The right shoulder has gotten worse in the last month or so. She notes abduction as well as internal and external rotation do cause some slight difficulty. No popping, locking or grinding. No sensory change.   Review of Systems     Objective:   Physical Exam Alert and in no distress. Full motion of the shoulder. Negative sulcus sign. Drop arm test was negative. Supraspinatus testing normal. She did have pain with Neer's  and Hawkins test.       Assessment & Plan:  Pain in joint, shoulder region - Plan: lidocaine 1% (40ml) in 0.45 NS (200ml) IRRIGATION, triamcinolone acetonide (KENALOG-40) injection 40 mg  I discussed treatment with her. She was amenable to having an injection. 40 mg of Kenalog and 3 cc of Xylocaine was injected into the subacromial bursa without difficulty. The skin was prepped prior to the injection with Betadine. She tolerated the procedure well and did note partial Aleve of her symptoms within the first several minutes. Steroid flare was discussed with the patient. If she has continued difficulty, she will call.

## 2014-03-05 LAB — TB SKIN TEST
INDURATION: 0 mm
TB SKIN TEST: NEGATIVE

## 2014-04-06 ENCOUNTER — Emergency Department (HOSPITAL_COMMUNITY)
Admission: EM | Admit: 2014-04-06 | Discharge: 2014-04-06 | Disposition: A | Discharge status: Home / Self Care | Payer: Managed Care, Other (non HMO) | Attending: Emergency Medicine | Admitting: Emergency Medicine

## 2014-04-06 ENCOUNTER — Emergency Department (HOSPITAL_COMMUNITY): Payer: Managed Care, Other (non HMO)

## 2014-04-06 ENCOUNTER — Encounter (HOSPITAL_COMMUNITY): Payer: Self-pay | Admitting: Emergency Medicine

## 2014-04-06 DIAGNOSIS — Z79899 Other long term (current) drug therapy: Secondary | ICD-10-CM | POA: Insufficient documentation

## 2014-04-06 DIAGNOSIS — N12 Tubulo-interstitial nephritis, not specified as acute or chronic: Secondary | ICD-10-CM

## 2014-04-06 DIAGNOSIS — Z3202 Encounter for pregnancy test, result negative: Secondary | ICD-10-CM | POA: Insufficient documentation

## 2014-04-06 DIAGNOSIS — I10 Essential (primary) hypertension: Secondary | ICD-10-CM | POA: Insufficient documentation

## 2014-04-06 DIAGNOSIS — Z791 Long term (current) use of non-steroidal anti-inflammatories (NSAID): Secondary | ICD-10-CM | POA: Insufficient documentation

## 2014-04-06 DIAGNOSIS — IMO0002 Reserved for concepts with insufficient information to code with codable children: Secondary | ICD-10-CM | POA: Insufficient documentation

## 2014-04-06 DIAGNOSIS — Z8719 Personal history of other diseases of the digestive system: Secondary | ICD-10-CM | POA: Insufficient documentation

## 2014-04-06 LAB — URINALYSIS, ROUTINE W REFLEX MICROSCOPIC
Bilirubin Urine: NEGATIVE
Glucose, UA: NEGATIVE mg/dL
Ketones, ur: NEGATIVE mg/dL
NITRITE: NEGATIVE
PH: 6 (ref 5.0–8.0)
Protein, ur: 30 mg/dL — AB
SPECIFIC GRAVITY, URINE: 1.028 (ref 1.005–1.030)
UROBILINOGEN UA: 1 mg/dL (ref 0.0–1.0)

## 2014-04-06 LAB — CBC WITH DIFFERENTIAL/PLATELET
Basophils Absolute: 0 10*3/uL (ref 0.0–0.1)
Basophils Relative: 0 % (ref 0–1)
Eosinophils Absolute: 0.1 10*3/uL (ref 0.0–0.7)
Eosinophils Relative: 1 % (ref 0–5)
HCT: 36.5 % (ref 36.0–46.0)
Hemoglobin: 11.7 g/dL — ABNORMAL LOW (ref 12.0–15.0)
LYMPHS ABS: 2.4 10*3/uL (ref 0.7–4.0)
LYMPHS PCT: 46 % (ref 12–46)
MCH: 26.7 pg (ref 26.0–34.0)
MCHC: 32.1 g/dL (ref 30.0–36.0)
MCV: 83.1 fL (ref 78.0–100.0)
Monocytes Absolute: 0.4 10*3/uL (ref 0.1–1.0)
Monocytes Relative: 8 % (ref 3–12)
NEUTROS ABS: 2.4 10*3/uL (ref 1.7–7.7)
NEUTROS PCT: 45 % (ref 43–77)
PLATELETS: 272 10*3/uL (ref 150–400)
RBC: 4.39 MIL/uL (ref 3.87–5.11)
RDW: 14.8 % (ref 11.5–15.5)
WBC: 5.4 10*3/uL (ref 4.0–10.5)

## 2014-04-06 LAB — BASIC METABOLIC PANEL
BUN: 18 mg/dL (ref 6–23)
CALCIUM: 9.5 mg/dL (ref 8.4–10.5)
CO2: 28 mEq/L (ref 19–32)
Chloride: 103 mEq/L (ref 96–112)
Creatinine, Ser: 1.04 mg/dL (ref 0.50–1.10)
GFR calc Af Amer: 79 mL/min — ABNORMAL LOW (ref >90)
GFR, EST NON AFRICAN AMERICAN: 68 mL/min — AB (ref >90)
Glucose, Bld: 102 mg/dL — ABNORMAL HIGH (ref 70–99)
POTASSIUM: 3.5 meq/L — AB (ref 3.7–5.3)
SODIUM: 142 meq/L (ref 137–147)

## 2014-04-06 LAB — URINE MICROSCOPIC-ADD ON

## 2014-04-06 LAB — PREGNANCY, URINE: PREG TEST UR: NEGATIVE

## 2014-04-06 MED ORDER — HYDROCODONE-ACETAMINOPHEN 5-325 MG PO TABS: 1.0000 | ORAL_TABLET | Freq: Four times a day (QID) | ORAL | Status: DC | PRN

## 2014-04-06 MED ORDER — SODIUM CHLORIDE 0.9 % IV BOLUS (SEPSIS)
1000.0000 mL | Freq: Once | INTRAVENOUS | Status: AC
  Administered 2014-04-06: 1000 mL via INTRAVENOUS

## 2014-04-06 MED ORDER — CIPROFLOXACIN HCL 500 MG PO TABS: 500.0000 mg | ORAL_TABLET | Freq: Two times a day (BID) | ORAL | Status: DC

## 2014-04-06 MED ORDER — MORPHINE SULFATE 4 MG/ML IJ SOLN
4.0000 mg | Freq: Once | INTRAMUSCULAR | Status: DC
  Filled 2014-04-06: qty 1

## 2014-04-06 MED ORDER — ONDANSETRON 4 MG PO TBDP: 4.0000 mg | ORAL_TABLET | Freq: Three times a day (TID) | ORAL | Status: DC | PRN

## 2014-04-06 MED ORDER — ONDANSETRON HCL 4 MG/2ML IJ SOLN
4.0000 mg | Freq: Once | INTRAMUSCULAR | Status: AC
  Administered 2014-04-06: 4 mg via INTRAVENOUS
  Filled 2014-04-06: qty 2

## 2014-04-06 MED ORDER — MORPHINE SULFATE 4 MG/ML IJ SOLN
4.0000 mg | Freq: Once | INTRAMUSCULAR | Status: AC
  Administered 2014-04-06: 4 mg via INTRAVENOUS
  Filled 2014-04-06: qty 1

## 2014-04-06 NOTE — ED Notes (Signed)
Pt presents to the department with lower left abdominal pain and left flank pain, pt reports this pain started three weeks ago. Pt reports she noticed blood in her urine yesterday, pt denies clots. Pt reports nausea with the pain. Pt is A&O X4. Pt denies vaginal bleeding/discharge, BM WNL. Pt reports urinary frequency.

## 2014-04-06 NOTE — ED Notes (Signed)
PA at BS.  

## 2014-04-06 NOTE — Discharge Instructions (Signed)
Please follow up with your primary care physician in 1-2 days. If you do not have one please call the [facility] number listed above. Please follow up with Dr. Brunilda Payor, urologist to schedule a follow up appointment as needed. Please take your antibiotic until completion. Please take pain medication and/or muscle relaxants as prescribed and as needed for pain. Please do not drive on narcotic pain medication or on muscle relaxants. Please read all discharge instructions and return precautions.    Pyelonephritis, Adult Pyelonephritis is a kidney infection. In general, there are 2 main types of pyelonephritis:  Infections that come on quickly without any warning (acute pyelonephritis).  Infections that persist for a long period of time (chronic pyelonephritis). CAUSES  Two main causes of pyelonephritis are:  Bacteria traveling from the bladder to the kidney. This is a problem especially in pregnant women. The urine in the bladder can become filled with bacteria from multiple causes, including:  Inflammation of the prostate gland (prostatitis).  Sexual intercourse in females.  Bladder infection (cystitis).  Bacteria traveling from the bloodstream to the tissue part of the kidney. Problems that may increase your risk of getting a kidney infection include:  Diabetes.  Kidney stones or bladder stones.  Cancer.  Catheters placed in the bladder.  Other abnormalities of the kidney or ureter. SYMPTOMS   Abdominal pain.  Pain in the side or flank area.  Fever.  Chills.  Upset stomach.  Blood in the urine (dark urine).  Frequent urination.  Strong or persistent urge to urinate.  Burning or stinging when urinating. DIAGNOSIS  Your caregiver may diagnose your kidney infection based on your symptoms. A urine sample may also be taken. TREATMENT  In general, treatment depends on how severe the infection is.   If the infection is mild and caught early, your  caregiver may treat you with oral antibiotics and send you home.  If the infection is more severe, the bacteria may have gotten into the bloodstream. This will require intravenous (IV) antibiotics and a hospital stay. Symptoms may include:  High fever.  Severe flank pain.  Shaking chills.  Even after a hospital stay, your caregiver may require you to be on oral antibiotics for a period of time.  Other treatments may be required depending upon the cause of the infection. HOME CARE INSTRUCTIONS   Take your antibiotics as directed. Finish them even if you start to feel better.  Make an appointment to have your urine checked to make sure the infection is gone.  Drink enough fluids to keep your urine clear or pale yellow.  Take medicines for the bladder if you have urgency and frequency of urination as directed by your caregiver. SEEK IMMEDIATE MEDICAL CARE IF:   You have a fever or persistent symptoms for more than 2-3 days.  You have a fever and your symptoms suddenly get worse.  You are unable to take your antibiotics or fluids.  You develop shaking chills.  You experience extreme weakness or fainting.  There is no improvement after 2 days of treatment. MAKE SURE YOU:  Understand these instructions.  Will watch your condition.  Will get help right away if you are not doing well or get worse. Document Released: 10/29/2005 Document Revised: 04/29/2012 Document Reviewed: 04/04/2011 [facility] Patient Information 2014 [place], [place].

## 2014-04-06 NOTE — ED Provider Notes (Signed)
CSN: [id number]     Arrival date & time 04/06/14  0636 History   First MD Initiated Contact with Patient 04/06/14 [phone]     Chief Complaint  Patient presents with  . Flank Pain  . Abdominal Pain  . Urinary Frequency     (Consider location/radiation/quality/duration/timing/severity/associated sxs/prior Treatment) HPI Comments: Patient is a [age] year old female clinical history significant for hypertension, umbilical hernia presenting to the emergency department for left-sided flank pain with radiation into abdomen. Patient states she had an initial mild intermittent discomfort in the left flank this started approximately 3 weeks ago. She states yesterday the pain increased in severity and is now sharp pain, she also started to develop urinary urgency, frequency, hematuria. She endorses associated nausea with the pain. Alleviating factors: None. Aggravating factors: None. Last menstrual period finished on May 8. Patient denies any fevers, chills, emesis constipation diarrhea, vaginal bleeding or discharge, dysuria. Abdominal surgical history includes umbilical hernia repair. No history of kidney stones or pyelonephritis in the past.    Patient is a 38 y.o. female presenting with flank pain, abdominal pain, and frequency.  Flank Pain Associated symptoms include abdominal pain and nausea. Pertinent negatives include no chills, fever or vomiting.  Abdominal Pain Associated symptoms: hematuria and nausea   Associated symptoms: no chills, no constipation, no diarrhea, no dysuria, no fever, no vaginal bleeding, no vaginal discharge and no vomiting   Urinary Frequency Associated symptoms include abdominal pain and nausea. Pertinent negatives include no chills, fever or vomiting.    Past Medical History  Diagnosis Date  . Umbilical hernia 10/2012  . Hypertension     under control with meds., has been on med. x 2 yr.   Past Surgical History  Procedure Laterality Date  . Wisdom tooth extraction       as a teenager  . Umbilical hernia repair  10/15/2012    Procedure: HERNIA REPAIR UMBILICAL ADULT;  Surgeon: Robyne AskewPaul S Toth III, MD;  Location: [facility];  Service: General;  Laterality: N/A;  umbilical hernia repair with mesh  . Insertion of mesh  10/15/2012    Procedure: INSERTION OF MESH;  Surgeon: Robyne AskewPaul S Toth III, MD;  Location: [facility];  Service: General;  Laterality: N/A;   No family history on file. History  Substance Use Topics  . Smoking status: Never Smoker   . Smokeless tobacco: Never Used  . Alcohol Use: No   OB History   Grav Para Term Preterm Abortions TAB SAB Ect Mult Living                 Review of Systems  Constitutional: Negative for fever and chills.  Gastrointestinal: Positive for nausea and abdominal pain. Negative for vomiting, diarrhea and constipation.  Genitourinary: Positive for urgency, frequency, hematuria and flank pain. Negative for dysuria, vaginal bleeding, vaginal discharge, menstrual problem and pelvic pain.  All other systems reviewed and are negative.     Allergies  Review of patient's allergies indicates no known allergies.  Home Medications   Prior to Admission medications   Medication Sig Start Date End Date Taking? Authorizing Provider  amLODipine (NORVASC) 5 MG tablet Take 1 tablet (5 mg total) by mouth daily. 03/02/14   Ronnald NianJohn C Lalonde, MD  cetirizine (ZYRTEC) 10 MG tablet Take 10 mg by mouth daily.    Historical Provider, MD  fluticasone (FLONASE) 50 MCG/ACT nasal spray Place 2 sprays into both nostrils daily. 03/02/14 07/24/15  Ronnald NianJohn C Lalonde, MD  lisinopril-hydrochlorothiazide [facility](PRINZIDE,ZESTORETIC) 20-25  MG per tablet Take 1 tablet by mouth daily. 03/02/14   Ronnald Nian, MD  Naproxen Sodium (ALEVE PO) Take 1 tablet by mouth daily.    Historical Provider, MD   BP 127/79  Pulse 55  Temp(Src) 97.4 F (36.3 C) (Oral)  Resp 16  Ht 5\' 9"  (1.753 m)  Wt 251 lb (113.853 kg)  BMI 37.05 kg/m2  SpO2 99%   LMP 03/19/2014 Physical Exam  Nursing note and vitals reviewed. Constitutional: She is oriented to person, place, and time. She appears well-developed and well-nourished. No distress.  HENT:  Head: Normocephalic and atraumatic.  Right Ear: External ear normal.  Left Ear: External ear normal.  Nose: Nose normal.  Mouth/Throat: Oropharynx is clear and moist.  Eyes: Conjunctivae are normal.  Neck: Normal range of motion. Neck supple.  Cardiovascular: Normal rate, regular rhythm and normal heart sounds.   Pulmonary/Chest: Effort normal and breath sounds normal. No respiratory distress.  Abdominal: Soft. Bowel sounds are normal. She exhibits no distension. There is no tenderness. There is CVA tenderness (left). There is no rigidity, no rebound and no guarding.  Musculoskeletal: Normal range of motion.  Neurological: She is alert and oriented to person, place, and time.  Skin: Skin is warm and dry. She is not diaphoretic.  Psychiatric: She has a normal mood and affect.    ED Course  Procedures (including critical care time) Medications  morphine 4 MG/ML injection 4 mg (0 mg Intravenous Hold 04/06/14 0900)  sodium chloride 0.9 % bolus 1,000 mL (0 mLs Intravenous Stopped 04/06/14 0912)  morphine 4 MG/ML injection 4 mg (4 mg Intravenous Given 04/06/14 0708)  ondansetron (ZOFRAN) injection 4 mg (4 mg Intravenous Given 04/06/14 0707)    Labs Review Labs Reviewed  URINALYSIS, ROUTINE W REFLEX MICROSCOPIC - Abnormal; Notable for the following:    APPearance HAZY (*)    Hgb urine dipstick LARGE (*)    Protein, ur 30 (*)    Leukocytes, UA SMALL (*)    All other components within normal limits  BASIC METABOLIC PANEL - Abnormal; Notable for the following:    Potassium 3.5 (*)    Glucose, Bld 102 (*)    GFR calc non Af Amer 68 (*)    GFR calc Af Amer 79 (*)    All other components within normal limits  CBC WITH DIFFERENTIAL - Abnormal; Notable for the following:    Hemoglobin 11.7 (*)    All  other components within normal limits  URINE MICROSCOPIC-ADD ON - Abnormal; Notable for the following:    Squamous Epithelial / LPF MANY (*)    Bacteria, UA MANY (*)    All other components within normal limits  URINE CULTURE  PREGNANCY, URINE    Imaging Review Ct Abdomen Pelvis Wo Contrast  04/06/2014   CLINICAL DATA:  Left flank pain  EXAM: CT ABDOMEN AND PELVIS WITHOUT CONTRAST  TECHNIQUE: Multidetector CT imaging of the abdomen and pelvis was performed following the standard protocol without IV contrast.  COMPARISON:  08/29/2012  FINDINGS: Sagittal images of the spine are unremarkable. Lung bases are unremarkable.  Unenhanced liver shows no biliary ductal dilatation. No calcified gallstones are noted within gallbladder. Unenhanced pancreas, spleen and adrenal glands are unremarkable. Unenhanced kidneys shows no nephrolithiasis. No hydronephrosis or hydroureter. No calcified ureteral calculi. Abdominal wall scarring noted in umbilical region. No aortic aneurysm.  No small bowel obstruction. No ascites or free air. No adenopathy. Terminal ileum is unremarkable. No pericecal inflammation. Normal appendix.  Bilateral  distal ureter is unremarkable. Unenhanced uterus is unremarkable. There is a right ovarian cyst measures 2.1 cm. Trace pelvic free fluid noted posterior cul-de-sac. No calcified calculi are noted within urinary bladder.  IMPRESSION: 1. No nephrolithiasis.  No hydronephrosis or hydroureter. 2. No calcified ureteral calculi. 3. Normal appendix.  No pericecal inflammation. 4. Right ovarian cyst measures 2.1 cm.  Trace pelvic free fluid.   Electronically Signed   By: Natasha Mead M.D.   On: 04/06/2014 09:20     EKG Interpretation None      MDM   Final diagnoses:  Pyelonephritis    Filed Vitals:   04/06/14 0937  BP:   Pulse:   Temp: 97.4 F (36.3 C)  Resp:    Afebrile, NAD, non-toxic appearing, AAOx4. Abdomen soft, nontender, nondistended. No peritoneal signs. Left CVA  tenderness present. No obvious nephrolithiasis or irritable stone appreciated on bedside ultrasound. No gross hydronephrosis on an ultrasound performed by Dr. Jodi Mourning. Creatinine is wnl. UA w/ large Hgb, small leukocytes, nitrate negative. CT scan w/o evidence of kidney stone. Will treat patient for pyelonephritis given history, physical exam and UA results. Urine culture sent. Patient is afebrile, normotensive without intractable nausea and vomiting, does not any requirements for inpatient treatment at this time. Return precautions discussed. Patient is agreeable to plan. Patient stable at time of discharge. Discussed with Dr. Jodi Mourning who agrees with plan. Patient is stable at time of discharge   Jeannetta Ellis, PA-C 04/06/14 1529

## 2014-04-07 LAB — URINE CULTURE

## 2014-04-07 NOTE — ED Provider Notes (Signed)
Medical screening examination/treatment/procedure(s) were conducted as a shared visit with non-physician practitioner(s) or resident and myself. I personally evaluated the patient during the encounter and agree with the findings and plan unless otherwise indicated.  I have personally reviewed any xrays and/ or EKG's with the provider and I agree with interpretation.  Patient with left flank pain nonradiating. Patient had milder similar pain 3 weeks prior however worsened significantly this morning. No history of kidney stones or kidney infections. No injuries. On exam patient has mild flank tenderness to palpation. Abdomen soft no focal guarding or tenderness.  Bedside ultrasound showed possibly mild hydronephrosis of the left kidney.  Clinically kidney stone versus kidney infection first left likely musculoskeletal. Plan for urinalysis, pain meds and recheck. Physician assistant and followup and decide on CT scan depending on clinical status.  Emergency Focused Ultrasound Exam  Limited retroperitoneal ultrasound of kidneys  Performed and interpreted by Dr. Jodi Mourning  Indication: flank pain  Focused abdominal ultrasound with both kidneys imaged in transverse and longitudinal planes in real-time.  Interpretation: mild left hydronephrosis visualized.  Images archived electronically  Left flank pain, Pyelonephritis   Enid Skeens, MD 04/07/14 [phone]

## 2014-06-02 ENCOUNTER — Encounter: Payer: Self-pay | Admitting: Family Medicine

## 2014-06-02 ENCOUNTER — Ambulatory Visit (INDEPENDENT_AMBULATORY_CARE_PROVIDER_SITE_OTHER): Payer: Managed Care, Other (non HMO) | Admitting: Family Medicine

## 2014-06-02 VITALS — BP 150/100 | HR 76 | Temp 100.0°F | Ht 68.0 in | Wt 246.0 lb

## 2014-06-02 DIAGNOSIS — M545 Low back pain, unspecified: Secondary | ICD-10-CM

## 2014-06-02 DIAGNOSIS — R0789 Other chest pain: Secondary | ICD-10-CM

## 2014-06-02 DIAGNOSIS — R071 Chest pain on breathing: Secondary | ICD-10-CM

## 2014-06-02 DIAGNOSIS — R82998 Other abnormal findings in urine: Secondary | ICD-10-CM

## 2014-06-02 DIAGNOSIS — R8290 Unspecified abnormal findings in urine: Secondary | ICD-10-CM

## 2014-06-02 LAB — POCT URINALYSIS DIPSTICK
BILIRUBIN UA: NEGATIVE
GLUCOSE UA: NEGATIVE
KETONES UA: NEGATIVE
NITRITE UA: NEGATIVE
PH UA: 7
Protein, UA: NEGATIVE
Spec Grav, UA: 1.01
Urobilinogen, UA: NEGATIVE

## 2014-06-02 MED ORDER — NAPROXEN 500 MG PO TABS: 500.0000 mg | ORAL_TABLET | Freq: Two times a day (BID) | ORAL | Status: DC

## 2014-06-02 NOTE — Progress Notes (Signed)
Chief Complaint  Patient presents with  . Back Pain    and also on her left side she is experiencing pain since last seen here end of May 2015. No burning with urination and no increase in frequency.   She was seen in ER 5/26 with left sided back pain and hematuria.   She had CT (see below) which didn't show stone or other abnormality.  She was diagnosed with pyelonephritis and treated with Cipro for 10 days.  She completed the course, but states she never completely got better.  The hematuria resolved, but the pain on the left side never resolved.  It hurts for her to lie on that side, and pain with certain movements.  Currently denies any hematuria, no change in urinary frequency.  She does notice some slight increase in urgency.  Denies abdominal pain.  No known fevers, chills.  Denies nausea, vomiting. Appetite is okay. [place], just returned last week.  She doesn't think her symptoms are any worse than they were about a month ago--persistent pain, but no worsening of symptoms.  Ct Abdomen Pelvis Wo Contrast  04/06/2014 CLINICAL DATA: Left flank pain EXAM: CT ABDOMEN AND PELVIS WITHOUT CONTRAST TECHNIQUE: Multidetector CT imaging of the abdomen and pelvis was performed following the standard protocol without IV contrast. COMPARISON: 08/29/2012 FINDINGS: Sagittal images of the spine are unremarkable. Lung bases are unremarkable. Unenhanced liver shows no biliary ductal dilatation. No calcified gallstones are noted within gallbladder. Unenhanced pancreas, spleen and adrenal glands are unremarkable. Unenhanced kidneys shows no nephrolithiasis. No hydronephrosis or hydroureter. No calcified ureteral calculi. Abdominal wall scarring noted in umbilical region. No aortic aneurysm. No small bowel obstruction. No ascites or free air. No adenopathy. Terminal ileum is unremarkable. No pericecal inflammation. Normal appendix. Bilateral distal ureter is unremarkable. Unenhanced  uterus is unremarkable. There is a right ovarian cyst measures 2.1 cm. Trace pelvic free fluid noted posterior cul-de-sac. No calcified calculi are noted within urinary bladder. IMPRESSION: 1. No nephrolithiasis. No hydronephrosis or hydroureter. 2. No calcified ureteral calculi. 3. Normal appendix. No pericecal inflammation. 4. Right ovarian cyst measures 2.1 cm. Trace pelvic free fluid. Electronically Signed By: Natasha MeadLiviu Pop M.D. On: 04/06/2014 09:20   Urine culture from ER visit 5/26 showed 75K colonies, but multiple bacterial morphotypes present.  Hypertension:  BP's at home are usually 132/82 at home (with a verified BP monitor), sometimes as low as 119/70. Denies dizziness, headaches, chest pain, palpitations or side effects of meds.  Past Medical History  Diagnosis Date  . Umbilical hernia 10/2012  . Hypertension     under control with meds., has been on med. x 2 yr.   Past Surgical History  Procedure Laterality Date  . Wisdom tooth extraction      as a teenager  . Umbilical hernia repair  10/15/2012    Procedure: HERNIA REPAIR UMBILICAL ADULT;  Surgeon: Robyne AskewPaul S Toth III, MD;  Location: [facility];  Service: General;  Laterality: N/A;  umbilical hernia repair with mesh  . Insertion of mesh  10/15/2012    Procedure: INSERTION OF MESH;  Surgeon: Robyne AskewPaul S Toth III, MD;  Location: [facility];  Service: General;  Laterality: N/A;   History   Social History  . Marital Status: Married    Spouse Name: N/A    Number of Children: 2  . Years of Education: N/A   Occupational History  . works at [employer], Teacher, musicstudent [employer]  .  middle school math teacher [employer]   Social History Main Topics  . Smoking status: Never Smoker   . Smokeless tobacco: Never Used  . Alcohol Use: No  . Drug Use: No  . Sexual Activity: Yes    Birth Control/ Protection: Condom     Comment: denies pregnancy now   Other Topics Concern  . Not on file   Social  History Narrative   Lives with husband and 2 kids (son, daughter).  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 amLODipine (NORVASC) 5 MG tablet Take 1 tablet (5 mg total) by mouth daily.   . cetirizine (ZYRTEC) 10 MG tablet Take 10 mg by mouth daily as needed for allergies.    . fluticasone (FLONASE) 50 MCG/ACT nasal spray Place 2 sprays into both nostrils daily.   Marland Kitchen lisinopril-hydrochlorothiazide (PRINZIDE,ZESTORETIC) 20-25 MG per tablet Take 1 tablet by mouth daily.   . naproxen sodium (ANAPROX) 220 MG tablet Take 220 mg by mouth daily as needed (pain). 06/02/2014: Using 1 tablet as needed for pain in her side/back (last took 2 days ago)  . [DISCONTINUED] ciprofloxacin (CIPRO) 500 MG tablet Take 1 tablet (500 mg total) by mouth every 12 (twelve) hours.   . [DISCONTINUED] HYDROcodone-acetaminophen (NORCO) 5-325 MG per tablet Take 1-2 tablets by mouth every 6 (six) hours as needed for severe pain.   . [DISCONTINUED] ondansetron (ZOFRAN ODT) 4 MG disintegrating tablet Take 1 tablet (4 mg total) by mouth every 8 (eight) hours as needed for nausea or vomiting.    No Known Allergies  ROS:  Denies fevers (she was not aware of low grade fever today, just knew she felt hot due to 90+ degree temps), denies chills, nausea, vomiting.  No vaginal discharge, odor, itch.  Not currently on her period. No bleeding/bruising, rash, URI symptoms, headaches, dizziness, chest pain or other complaints.  See HPI.  PHYSICAL EXAM: BP 150/100  Pulse 76  Temp(Src) 100 F (37.8 C) (Tympanic)  Ht 5\' 8"  (1.727 m)  Wt 246 lb (111.585 kg)  BMI 37.41 kg/m2  LMP 05/11/2014 Well developed, pleasant female, in no distress.  +mod discomfort with moving and light palpation over lateral chest wall. Neck no lymphadenopathy or mass Heart: regular rate and rhythm without murmur Lungs: clear bilaterally Abdomen: soft,  nontender, no organomegaly or mass Back: no spinal tenderness.  Mild CVA tenderness on the left, but she mostly has pain more lateral, and is very tender superficially along the lateral, inferior/floating ribs.   Extremities: no edema Psych: normal mood, affect, hygiene and grooming  Urine dip: Trace blood, 3+ leuks  ASSESSMENT/PLAN:  Left-sided chest wall pain - Plan: naproxen (NAPROSYN) 500 MG tablet  Left low back pain, with sciatica presence unspecified - Plan: POCT Urinalysis Dipstick  Abnormal urinalysis - with LG fever, concerning for poss pyelo, but last cx was negative, no urinary symptoms, and pain clearly has MSK component.  Await culture for treatment - Plan: Urine culture  Treat for musculoskeletal pain, given lack of response to former treatment for pyelo, and based on her exam and symptoms (despite abnormal u/a today) NSAID precautions reviewed Urine culture sent--will call and start on ABX if positive

## 2014-06-02 NOTE — Patient Instructions (Signed)
Stop taking Aleve. Instead, take the prescription naproxen twice daily with food.  Take it regularly until your pain has completely resolved  Try moist/hot compresses.  Avoid bending/twisting.  Call if you develop high fevers, chills, vomiting. We will be in touch within the week with your urine culture results, and plan to start antibiotics if it shows infection.

## 2014-06-04 LAB — URINE CULTURE: Colony Count: >=100000

## 2014-07-20 ENCOUNTER — Ambulatory Visit (INDEPENDENT_AMBULATORY_CARE_PROVIDER_SITE_OTHER): Payer: Managed Care, Other (non HMO) | Admitting: Family Medicine

## 2014-07-20 ENCOUNTER — Encounter: Payer: Self-pay | Admitting: Family Medicine

## 2014-07-20 VITALS — BP 130/90 | HR 82 | Wt 248.0 lb

## 2014-07-20 DIAGNOSIS — R109 Unspecified abdominal pain: Secondary | ICD-10-CM

## 2014-07-20 DIAGNOSIS — M719 Bursopathy, unspecified: Secondary | ICD-10-CM

## 2014-07-20 DIAGNOSIS — M67919 Unspecified disorder of synovium and tendon, unspecified shoulder: Secondary | ICD-10-CM

## 2014-07-20 DIAGNOSIS — M7581 Other shoulder lesions, right shoulder: Secondary | ICD-10-CM

## 2014-07-20 LAB — POCT URINALYSIS DIPSTICK
Bilirubin, UA: NEGATIVE
GLUCOSE UA: NEGATIVE
Ketones, UA: NEGATIVE
NITRITE UA: NEGATIVE
Protein, UA: NEGATIVE
Spec Grav, UA: 1.015
UROBILINOGEN UA: NEGATIVE
pH, UA: 6

## 2014-07-20 MED ORDER — LIDOCAINE HCL (PF) 2 % IJ SOLN
3.0000 mL | Freq: Once | INTRAMUSCULAR | Status: AC
  Administered 2014-07-20: 3 mL via INTRADERMAL

## 2014-07-20 MED ORDER — TRIAMCINOLONE ACETONIDE 40 MG/ML IJ SUSP
40.0000 mg | Freq: Once | INTRAMUSCULAR | Status: AC
  Administered 2014-07-20: 40 mg via INTRAMUSCULAR

## 2014-07-20 NOTE — Progress Notes (Signed)
   Subjective:    Patient ID: Debbie Gibson, female    DOB: January 13, 1976, 38 y.o.   MRN: [id number]  HPI She is here for consultation concerning continued right shoulder pain as well as left flank pain. She has a previous history of bursitis and was given an injection which did last for several months. The pain has reoccurred. The pain has been getting much worse and she notes pain radiating down her arm and into her upper back. No numbness, tingling or shoulder instability. Complains of left flank pain since May. She was seen in the emergency room and evaluation including a CT without contrast was ordered which was negative. She was treated for a UTI however her pain has continued. She cannot associate the pain with any urinary symptoms, nausea, vomiting, diarrhea or constipation, menstrual related, musculoskeletal. The pain is interfering with her sleep. She does state that Aleve does help.   Review of Systems     Objective:   Physical Exam Alert and in no distress. Exam of the shoulder shows no laxity or point tenderness. Negative drop arm test. Neer's and Hawkins test was uncomfortable. There is some pain on abduction and external rotation as well as internal rotation. Strength and sensation are normal. Abdominal exam shows active bowel sounds without masses or tenderness. Urine microscopic showed bacteria and was contaminated.       Assessment & Plan:  Flank pain - Plan: POCT Urinalysis Dipstick, CULTURE, URINE COMPREHENSIVE  Rotator cuff tendinitis, right - Plan: triamcinolone acetonide (KENALOG-40) injection 40 mg, lidocaine (XYLOCAINE) 2 % injection 3 mL  I will culture her urine and evaluate this first and if negative, get a CT with contrast. Discussed treatment of her right shoulder with another injection versus referral. She is decided to have another injection which was accomplished without difficulty. She did obtain some relief of her symptoms quickly. I explained that if this  reoccurs within the next several months, further evaluation and treatment will be needed.

## 2014-07-24 LAB — CULTURE, URINE COMPREHENSIVE: Colony Count: >=100000

## 2014-07-24 MED ORDER — CIPROFLOXACIN HCL 500 MG PO TABS: 500.0000 mg | ORAL_TABLET | Freq: Two times a day (BID) | ORAL | Status: DC

## 2014-07-24 NOTE — Addendum Note (Signed)
Addended by: Ronnald Nian on: 07/24/2014 02:51 PM   Modules accepted: Orders

## 2014-08-17 ENCOUNTER — Telehealth: Payer: Self-pay | Admitting: Internal Medicine

## 2014-08-17 NOTE — Telephone Encounter (Signed)
Pt states that her shoulder pain is getting worse and now is having pain down in her lower back and spine and still on her side. She is wanting to know what the next options are for her. Please call pt and let her know

## 2014-08-17 NOTE — Telephone Encounter (Signed)
PT HAS APPOINTMENT 

## 2014-08-17 NOTE — Telephone Encounter (Signed)
She was supposed to come back for a recheck on her urine. Have her schedule an appointment

## 2014-08-18 ENCOUNTER — Ambulatory Visit
Admission: RE | Admit: 2014-08-18 | Discharge: 2014-08-18 | Disposition: A | Discharge status: Home / Self Care | Payer: Managed Care, Other (non HMO) | Source: Ambulatory Visit | Attending: Family Medicine | Admitting: Family Medicine

## 2014-08-18 ENCOUNTER — Ambulatory Visit (INDEPENDENT_AMBULATORY_CARE_PROVIDER_SITE_OTHER): Payer: Managed Care, Other (non HMO) | Admitting: Family Medicine

## 2014-08-18 ENCOUNTER — Telehealth: Payer: Self-pay

## 2014-08-18 ENCOUNTER — Encounter: Payer: Self-pay | Admitting: Family Medicine

## 2014-08-18 VITALS — BP 120/78 | Ht 68.0 in | Wt 244.0 lb

## 2014-08-18 DIAGNOSIS — M25511 Pain in right shoulder: Secondary | ICD-10-CM

## 2014-08-18 DIAGNOSIS — M545 Low back pain, unspecified: Secondary | ICD-10-CM

## 2014-08-18 DIAGNOSIS — G8929 Other chronic pain: Secondary | ICD-10-CM

## 2014-08-18 DIAGNOSIS — R109 Unspecified abdominal pain: Secondary | ICD-10-CM

## 2014-08-18 DIAGNOSIS — Z23 Encounter for immunization: Secondary | ICD-10-CM

## 2014-08-18 DIAGNOSIS — R1012 Left upper quadrant pain: Secondary | ICD-10-CM

## 2014-08-18 LAB — POCT URINALYSIS DIPSTICK
BILIRUBIN UA: NEGATIVE
Glucose, UA: NEGATIVE
Ketones, UA: NEGATIVE
Nitrite, UA: NEGATIVE
Protein, UA: NEGATIVE
RBC UA: NEGATIVE
Urobilinogen, UA: NEGATIVE
pH, UA: 7

## 2014-08-18 NOTE — Progress Notes (Signed)
   Subjective:    Patient ID: Debbie Gibson, female    DOB: 07-21-1976, 38 y.o.   MRN: [id number]  HPI She is here for evaluation of several issues. First is right shoulder pain. She initially injured this one year ago when she fell on some steps and landed directly on the posterior aspect of the right shoulder. Since then she's been seen twice and did have injections which helped for a short period of time and she continues to have pain in the right shoulder especially with abduction and external rotator. She has been taking Aleve regularly with no benefit . She also continues to have left flank pain. On her last visit urinalysis was positive and she was treated with an antibiotic. The flank pain has continued. She notes that sometimes when she lies down, the pain gets worse but not read time she lies down. She was seen in the emergency room in the spring for this. The urinalysis in the ER did show red cells. CT scan was done and was negative. She was treated as if she had pyelonephritis. On her previous visit the urinalysis was equivocal however the culture did grow bacteria. She was treated appropriately. She also complains of continued difficulty with low back pain over the last year that again occurred when she fell. In the last 6 weeks the pain has become more sharp and constant and eating and sitting for long periods of time increases the pain. She states when she lies down sometimes she will get bilateral leg pain that goes down to her knees but no tingling or weakness.  Review of Systems     Objective:   Physical Exam Alert and in no distress. Right shoulder exam does show pain in motion in all directions. Negative drop arm test other rotator cuff muscles cause discomfort when testing. No laxity noted. Negative drop arm test. Abdominal exam does show tenderness in the left upper quadrant but no rebound. Normal bowel sounds. No masses noted. Back exam shows full motion of the back with  good rotation however pain in any direction including rotation causes pain. Hip motion normal. Negative straight leg raising. Normal DTRs.       Assessment & Plan:  Immunization due - Plan: POCT Urinalysis Dipstick, Flu Vaccine QUAD 36+ mos PF IM (Fluarix Quad PF)  Right shoulder pain - Plan: DG Shoulder Right  Left flank pain, chronic - Plan: CT Abd Wo & W Cm  Bilateral low back pain without sciatica - Plan: DG Lumbar Spine Complete  she will probably need to be referred to orthopedics especially for the right shoulder and possibly the back although physical therapy will be considered. The left flank pain is problematic. I will start with a CT with and without contrast. Approximately 45 minutes spent discussing all these issues with her and her husband.

## 2014-08-18 NOTE — Telephone Encounter (Signed)
DR.LALONDE INSURANCE SAID THEY HAVE TO HAVE DR. THERE TO REVIEW THE CASE AND CAN TAKE UP TO 2 BUSINESS DAYS CASE # [id number] AND IF OKAYED  THEN IT HAS TO BE DONE AT [facility] AT [address] [phone]

## 2014-08-20 ENCOUNTER — Other Ambulatory Visit: Payer: Self-pay

## 2014-08-20 ENCOUNTER — Telehealth: Payer: Self-pay

## 2014-08-20 DIAGNOSIS — M545 Low back pain: Secondary | ICD-10-CM

## 2014-08-20 NOTE — Telephone Encounter (Signed)
Go ahead and order this.

## 2014-08-20 NOTE — Telephone Encounter (Signed)
DR.LALONDE PATIENT INS. CALLED AND SAID THE CT ABD W/WO WAS DENIED BUT APPROVED CT ABD W/ PELVIS W/WO AND SHE HAD ONE DONE 04/06/14 DO YOU STILL WANT THIS DONE AND YOU ALSO WANT TO HAVE A MRI OF HER RIGHT SHOULDER AND PHYSICAL THERAPY FOR HER LOW BACK. I JUST WANT TO MAKE SURE BEFORE I SET ALL THIS UP

## 2014-08-23 ENCOUNTER — Other Ambulatory Visit: Payer: Self-pay

## 2014-08-23 ENCOUNTER — Telehealth: Payer: Self-pay

## 2014-08-23 DIAGNOSIS — M25511 Pain in right shoulder: Secondary | ICD-10-CM

## 2014-08-23 NOTE — Telephone Encounter (Signed)
PT INFORMED OF APPOINTMENTS FOR CT AND MRI AT [facility] [address] [place] AT 12:45 [address] OCT 15TH

## 2014-09-03 ENCOUNTER — Telehealth: Payer: Self-pay | Admitting: Family Medicine

## 2014-09-03 ENCOUNTER — Encounter: Payer: Self-pay | Admitting: Family Medicine

## 2014-09-03 NOTE — Telephone Encounter (Signed)
Pt was sent by us for a ct and mri last week. Pt has been waiting for results. Looks like test are back but nothing noted and no call documented. Please call pt asap with results and what needs to happen next. Pt can be reached at [phone].

## 2014-09-06 ENCOUNTER — Other Ambulatory Visit: Payer: Self-pay

## 2014-09-06 DIAGNOSIS — R1032 Left lower quadrant pain: Secondary | ICD-10-CM

## 2014-09-06 NOTE — Telephone Encounter (Signed)
i have made referrals to [facility] and to [facility] pt is aware

## 2014-09-06 NOTE — Telephone Encounter (Signed)
Set her up to see orthopedics for her shoulder and GI for her abdominal complaints.

## 2014-09-07 ENCOUNTER — Ambulatory Visit: Payer: Managed Care, Other (non HMO) | Attending: Family Medicine

## 2014-09-10 ENCOUNTER — Encounter: Payer: Self-pay | Admitting: Gastroenterology

## 2014-09-20 ENCOUNTER — Encounter: Payer: Self-pay | Admitting: Gastroenterology

## 2014-09-20 ENCOUNTER — Ambulatory Visit (INDEPENDENT_AMBULATORY_CARE_PROVIDER_SITE_OTHER): Payer: Managed Care, Other (non HMO) | Admitting: Gastroenterology

## 2014-09-20 VITALS — BP 144/92 | HR 64 | Ht 67.5 in | Wt 250.4 lb

## 2014-09-20 DIAGNOSIS — R1012 Left upper quadrant pain: Secondary | ICD-10-CM

## 2014-09-20 DIAGNOSIS — R109 Unspecified abdominal pain: Secondary | ICD-10-CM | POA: Insufficient documentation

## 2014-09-20 MED ORDER — PANTOPRAZOLE SODIUM 40 MG PO TBEC: 40.0000 mg | DELAYED_RELEASE_TABLET | Freq: Every day | ORAL | Status: DC

## 2014-09-20 NOTE — Progress Notes (Signed)
Reviewed and agree with management plan.  Blain Hunsucker T. Dickson Kostelnik, MD FACG 

## 2014-09-20 NOTE — Patient Instructions (Addendum)
You have been scheduled for an endoscopy. Please follow written instructions given to you at your visit today. If you use inhalers (even only as needed), please bring them with you on the day of your procedure. Your physician has requested that you go to www.startemmi.com and enter the access code given to you at your visit today(SENT TO YOUR EMAIL). This web site gives a general overview about your procedure. However, you should still follow specific instructions given to you by our office regarding your preparation for the procedure.  We have sent the following medications to your pharmacy for you to pick up at your convenience: Pantoprazole 40 mg, please take one table by mouth once daily

## 2014-09-20 NOTE — Progress Notes (Signed)
09/20/2014 Debbie Gibson [id number] 26-Mar-1976   HISTORY OF PRESENT ILLNESS:  This is a pleasant 38 year old female who is new to our practice and has been referred by Dr. Susann GivensLalonde for evaluation of left side/flank pain.  She has limited PMH.  She says that this pain has been present since about April of this year, so about 7 months.  She says that she has been treated for musculoskeletal and urinary sources of pain without resolution of her symptoms.  Pain is on left upper side/flank area.  Says that it is a constant ache deep inside but is actually worse at night and keeps her from sleeping.  No affected by eating.  Denies nausea, vomiting, fever, or bowel issues.  The only other GI symptom that she reports is some increased heartburn recently for which she takes occasional Zantac.  CT scan of the abdomen and pelvis with and without contrast on 08/26/2014 was unremarkable.  CBC and BMP in May were relatively unremarkable as well.   Past Medical History  Diagnosis Date  . Umbilical hernia 10/2012  . Hypertension     under control with meds., has been on med. x 2 yr.   Past Surgical History  Procedure Laterality Date  . Wisdom tooth extraction      as a teenager  . Umbilical hernia repair  10/15/2012    Procedure: HERNIA REPAIR UMBILICAL ADULT;  Surgeon: Robyne AskewPaul S Toth III, MD;  Location: [facility];  Service: General;  Laterality: N/A;  umbilical hernia repair with mesh  . Insertion of mesh  10/15/2012    Procedure: INSERTION OF MESH;  Surgeon: Robyne AskewPaul S Toth III, MD;  Location: [facility];  Service: General;  Laterality: N/A;    reports that she has never smoked. She has never used smokeless tobacco. She reports that she does not drink alcohol or use illicit drugs. family history includes Breast cancer in her maternal aunt; Diabetes in her brother; Heart disease in her father. There is no history of Colon cancer, Colon polyps, Kidney disease, or Esophageal  cancer. No Known Allergies    Outpatient Encounter Prescriptions as of 09/20/2014  Medication Sig  . amLODipine (NORVASC) 5 MG tablet Take 1 tablet (5 mg total) by mouth daily.  . cetirizine (ZYRTEC) 10 MG tablet Take 10 mg by mouth daily as needed for allergies.   . fluticasone (FLONASE) 50 MCG/ACT nasal spray Place 2 sprays into both nostrils daily.  Marland Kitchen. lisinopril-hydrochlorothiazide (PRINZIDE,ZESTORETIC) 20-25 MG per tablet Take 1 tablet by mouth daily.  . naproxen sodium (ANAPROX) 220 MG tablet Take 220 mg by mouth 2 (two) times daily with a meal. OTC  . pantoprazole (PROTONIX) 40 MG tablet Take 1 tablet (40 mg total) by mouth daily.     REVIEW OF SYSTEMS  : All other systems reviewed and negative except where noted in the History of Present Illness.   PHYSICAL EXAM: BP 144/92 mmHg  Pulse 64  Ht 5' 7.5" (1.715 m)  Wt 250 lb 6 oz (113.569 kg)  BMI 38.61 kg/m2  LMP 09/19/2014 General: Well developed black female in no acute distress Head: Normocephalic and atraumatic Eyes:  Sclerae anicteric, conjunctiva pink. Ears: Normal auditory acuity Lungs: Clear throughout to auscultation Heart: Regular rate and rhythm Abdomen: Soft, non-distended.  Normal bowel sounds.  Mild LUQ and left side/flank TTP without R/R/G.   Musculoskeletal: Symmetrical with no gross deformities  Skin: No lesions on visible extremities Extremities: No edema  Neurological:  Alert oriented x 4, grossly non-focal Psychological:  Alert and cooperative. Normal mood and affect  ASSESSMENT AND PLAN: -LUQ/left flank, side pain:  Unsure if this is GI related, but she's had negative CT scan and has been treated unsuccessfully for musculoskeletal and urinary tract causes of pain.  Has some increase heartburn recently as well.  ? Ulcer disease.  Will schedule EGD for further evaluation.  In the interim we will place her on pantoprazole 40 mg daily.

## 2014-09-21 ENCOUNTER — Ambulatory Visit (AMBULATORY_SURGERY_CENTER): Payer: Managed Care, Other (non HMO) | Admitting: Gastroenterology

## 2014-09-21 ENCOUNTER — Encounter: Payer: Self-pay | Admitting: Gastroenterology

## 2014-09-21 VITALS — BP 145/95 | HR 59 | Temp 98.1°F | Resp 15 | Ht 67.0 in | Wt 250.0 lb

## 2014-09-21 DIAGNOSIS — K2950 Unspecified chronic gastritis without bleeding: Secondary | ICD-10-CM

## 2014-09-21 DIAGNOSIS — R109 Unspecified abdominal pain: Secondary | ICD-10-CM

## 2014-09-21 DIAGNOSIS — R1012 Left upper quadrant pain: Secondary | ICD-10-CM

## 2014-09-21 DIAGNOSIS — R0789 Other chest pain: Secondary | ICD-10-CM

## 2014-09-21 MED ORDER — SODIUM CHLORIDE 0.9 % IV SOLN: 500.0000 mL | INTRAVENOUS | Status: DC

## 2014-09-21 NOTE — Progress Notes (Signed)
Procedure ends, to recovery, report given and VSS. 

## 2014-09-21 NOTE — Patient Instructions (Signed)
YOU HAD AN ENDOSCOPIC PROCEDURE TODAY AT THE [facility]: Refer to the procedure report that was given to you for any specific questions about what was found during the examination.  If the procedure report does not answer your questions, please call your gastroenterologist to clarify.  If you requested that your care partner not be given the details of your procedure findings, then the procedure report has been included in a sealed envelope for you to review at your convenience later.  YOU SHOULD EXPECT: Some feelings of bloating in the abdomen. Passage of more gas than usual.  Walking can help get rid of the air that was put into your GI tract during the procedure and reduce the bloating. If you had a lower endoscopy (such as a colonoscopy or flexible sigmoidoscopy) you may notice spotting of blood in your stool or on the toilet paper. If you underwent a bowel prep for your procedure, then you may not have a normal bowel movement for a few days.  DIET: Your first meal following the procedure should be a light meal and then it is ok to progress to your normal diet.  A half-sandwich or bowl of soup is an example of a good first meal.  Heavy or fried foods are harder to digest and may make you feel nauseous or bloated.  Likewise meals heavy in dairy and vegetables can cause extra gas to form and this can also increase the bloating.  Drink plenty of fluids but you should avoid alcoholic beverages for 24 hours.  ACTIVITY: Your care partner should take you home directly after the procedure.  You should plan to take it easy, moving slowly for the rest of the day.  You can resume normal activity the day after the procedure however you should NOT DRIVE or use heavy machinery for 24 hours (because of the sedation medicines used during the test).    SYMPTOMS TO REPORT IMMEDIATELY: A gastroenterologist can be reached at any hour.  During normal business hours, 8:30 AM to 5:00 PM Monday through Friday,  call [phone].  After hours and on weekends, please call the GI answering service at [phone] who will take a message and have the physician on call contact you.    Following upper endoscopy (EGD)  Vomiting of blood or coffee ground material  New chest pain or pain under the shoulder blades  Painful or persistently difficult swallowing  New shortness of breath  Fever of 100F or higher  Black, tarry-looking stools  FOLLOW UP: If any biopsies were taken you will be contacted by phone or by letter within the next 1-3 weeks.  Call your gastroenterologist if you have not heard about the biopsies in 3 weeks.  Our staff will call the home number listed on your records the next business day following your procedure to check on you and address any questions or concerns that you may have at that time regarding the information given to you following your procedure. This is a courtesy call and so if there is no answer at the home number and we have not heard from you through the emergency physician on call, we will assume that you have returned to your regular daily activities without incident.  SIGNATURES/CONFIDENTIALITY: You and/or your care partner have signed paperwork which will be entered into your electronic medical record.  These signatures attest to the fact that that the information above on your After Visit Summary has been reviewed and is understood.  Full   responsibility of the confidentiality of this discharge information lies with you and/or your care-partner.   Resume medications. Information given on gastritis with discharge instructions. 

## 2014-09-21 NOTE — Op Note (Signed)
[facility] [address] [place], [id number]   ENDOSCOPY PROCEDURE REPORT  PATIENT: [name], Debbie  MR#: [id number] BIRTHDATE: [birth year] , 38  yrs. old GENDER: female ENDOSCOPIST: Meryl DareMalcolm T Stark, MD, Clementeen GrahamFACG REFERRED BY:  Sharlot GowdaJohn LaLonde, M.D. PROCEDURE DATE:  09/21/2014 PROCEDURE:  EGD w/ biopsy ASA CLASS:     Class II INDICATIONS:  chest pain and abdominal pain in upper left quadrant. Pain along left lower ribs. MEDICATIONS: Monitored anesthesia care and Propofol 200 mg IV TOPICAL ANESTHETIC: none DESCRIPTION OF PROCEDURE: After the risks benefits and alternatives of the procedure were thoroughly explained, informed consent was obtained.  The LB [id number] endoscope was introduced through the mouth and advanced to the second portion of the duodenum , Without limitations.  The instrument was slowly withdrawn as the mucosa was fully examined.  STOMACH: Mild erosive gastritis was found in the gastric antrum. Multiple biopsies were performed.   The stomach otherwise appeared normal. ESOPHAGUS: The mucosa of the esophagus appeared normal. DUODENUM: The duodenal mucosa showed no abnormalities.  Retroflexed views revealed no abnormalities.     The scope was then withdrawn from the patient and the procedure completed.  COMPLICATIONS: There were no immediate complications.  ENDOSCOPIC IMPRESSION: 1.   Mild erosive gastritis in the gastric antrum; multiple biopsies performed 2.   The EGD otherwise appeared normal  RECOMMENDATIONS: 1.  Await pathology results 2.  Continue PPI for 8 weeks 3.  Pain does not seem to be GI in origin. Call to schedule a follow-up appointment with primary MD 1 month  eSigned:  Meryl DareMalcolm T Stark, MD, [facility]AC[facility] 09/21/2014 2:51 PM

## 2014-09-21 NOTE — Progress Notes (Signed)
Called to room to assist during endoscopic procedure.  Patient ID and intended procedure confirmed with present staff. Received instructions for my participation in the procedure from the performing physician.  

## 2014-09-22 ENCOUNTER — Telehealth: Payer: Self-pay | Admitting: Family Medicine

## 2014-09-22 ENCOUNTER — Telehealth: Payer: Self-pay | Admitting: *Deleted

## 2014-09-22 NOTE — Telephone Encounter (Signed)
No answer, left message to call if questions or concerns. 

## 2014-09-22 NOTE — Telephone Encounter (Signed)
Pt called and [facility] will not give her the cortisone shot due to the fluctuation of her blood pressure.  156/100 and 147/100.  Pt wants to know if ok for her to have it  Pt ph [phone].  She is taking her meds daily

## 2014-09-22 NOTE — Telephone Encounter (Signed)
Call in Norvasc 10 mg. Call and let her know that we are doing this and have her come here in 2 weeks for blood pressure recheck

## 2014-09-23 ENCOUNTER — Other Ambulatory Visit: Payer: Self-pay

## 2014-09-23 MED ORDER — AMLODIPINE BESYLATE 10 MG PO TABS: 10.0000 mg | ORAL_TABLET | Freq: Every day | ORAL | Status: DC

## 2014-09-23 NOTE — Telephone Encounter (Signed)
This is for you!

## 2014-09-23 NOTE — Telephone Encounter (Signed)
Med sent in patient is aware and has appointment 11/30

## 2014-09-27 ENCOUNTER — Encounter: Payer: Self-pay | Admitting: Gastroenterology

## 2014-09-28 ENCOUNTER — Encounter: Payer: Self-pay | Admitting: Gastroenterology

## 2014-09-29 ENCOUNTER — Other Ambulatory Visit: Payer: Self-pay | Admitting: *Deleted

## 2014-09-29 MED ORDER — OMEPRAZOLE 40 MG PO CPDR: 40.0000 mg | DELAYED_RELEASE_CAPSULE | Freq: Every day | ORAL | Status: DC

## 2014-10-11 ENCOUNTER — Encounter: Payer: Self-pay | Admitting: Family Medicine

## 2014-10-11 ENCOUNTER — Ambulatory Visit (INDEPENDENT_AMBULATORY_CARE_PROVIDER_SITE_OTHER): Payer: Managed Care, Other (non HMO) | Admitting: Family Medicine

## 2014-10-11 VITALS — BP 124/90 | Wt 248.0 lb

## 2014-10-11 DIAGNOSIS — E669 Obesity, unspecified: Secondary | ICD-10-CM | POA: Insufficient documentation

## 2014-10-11 DIAGNOSIS — I10 Essential (primary) hypertension: Secondary | ICD-10-CM

## 2014-10-11 MED ORDER — AMLODIPINE BESYLATE 10 MG PO TABS: 10.0000 mg | ORAL_TABLET | Freq: Every day | ORAL | Status: DC

## 2014-10-11 MED ORDER — LISINOPRIL-HYDROCHLOROTHIAZIDE 20-25 MG PO TABS: 1.0000 | ORAL_TABLET | Freq: Every day | ORAL | Status: DC

## 2014-10-11 NOTE — Progress Notes (Signed)
   Subjective:    Patient ID: Debbie Gibson, female    DOB: [birth year], 38 y.o.   MRN: [id number]  HPI She is here for blood pressure recheck. Presently she is taking amlodipine and lisinopril/HCTZ. She is having no difficulty with this. She has made some right and exercise changes and feels quite good about this.   Review of Systems     Objective:   Physical Exam Alert and in no distress. Blood pressure is recorded.       Assessment & Plan:  Essential hypertension  Obesity (BMI 30-39.9)  Essential hypertension, benign - Plan: lisinopril-hydrochlorothiazide (PRINZIDE,ZESTORETIC) 20-25 MG per tablet, amLODipine (NORVASC) 10 MG tablet  the pressure is now down to a more manageable range. I did encourage her to continue with her diet and exercise program. She plans to eventually get down to a size 8-10.

## 2014-11-01 ENCOUNTER — Emergency Department (HOSPITAL_COMMUNITY)
Admission: EM | Admit: 2014-11-01 | Discharge: 2014-11-01 | Disposition: A | Discharge status: Home / Self Care | Payer: Managed Care, Other (non HMO) | Source: Home / Self Care | Attending: Emergency Medicine | Admitting: Emergency Medicine

## 2014-11-01 ENCOUNTER — Encounter (HOSPITAL_COMMUNITY): Payer: Self-pay | Admitting: Emergency Medicine

## 2014-11-01 DIAGNOSIS — L03114 Cellulitis of left upper limb: Secondary | ICD-10-CM

## 2014-11-01 DIAGNOSIS — B029 Zoster without complications: Secondary | ICD-10-CM

## 2014-11-01 MED ORDER — DOXYCYCLINE HYCLATE 100 MG PO CAPS: 100.0000 mg | ORAL_CAPSULE | Freq: Two times a day (BID) | ORAL | Status: DC

## 2014-11-01 MED ORDER — AMOXICILLIN 875 MG PO TABS: 875.0000 mg | ORAL_TABLET | Freq: Two times a day (BID) | ORAL | Status: DC

## 2014-11-01 MED ORDER — VALACYCLOVIR HCL 1 G PO TABS: 1000.0000 mg | ORAL_TABLET | Freq: Three times a day (TID) | ORAL | Status: AC

## 2014-11-01 NOTE — ED Provider Notes (Signed)
CSN: [id number]     Arrival date & time 11/01/14  0935 History   First MD Initiated Contact with Patient 11/01/14 1002     No chief complaint on file.  (Consider location/radiation/quality/duration/timing/severity/associated sxs/prior Treatment) HPI           38 year old female presents complaining of a rash. She has had this rash on her left upper arm for approximately 3 days, gradually worsening. It is painful and itchy. He first started she felt an itching sensation but there was no obvious rash. The next day a group of vesicles appear on this area, and since then she has an area of expanding redness, pain, and tenderness of the upper arm. She tried treating this with over-the-counter anti-itch cream but it is not helping. She also has pain in the left axilla. She felt a little nauseous this morning after eating but denies any other symptoms. No fever, chills, NVD, chest pain, shortness of breath.    Past Medical History  Diagnosis Date  . Umbilical hernia 10/2012  . Hypertension     under control with meds., has been on med. x 2 yr.   Past Surgical History  Procedure Laterality Date  . Wisdom tooth extraction      as a teenager  . Umbilical hernia repair  10/15/2012    Procedure: HERNIA REPAIR UMBILICAL ADULT;  Surgeon: Robyne AskewPaul S Toth III, MD;  Location: [facility];  Service: General;  Laterality: N/A;  umbilical hernia repair with mesh  . Insertion of mesh  10/15/2012    Procedure: INSERTION OF MESH;  Surgeon: Robyne AskewPaul S Toth III, MD;  Location: [facility];  Service: General;  Laterality: N/A;   Family History  Problem Relation Age of Onset  . Breast cancer Maternal Aunt   . Diabetes Brother   . Heart disease Father     CHF  . Colon cancer Neg Hx   . Colon polyps Neg Hx   . Kidney disease Neg Hx   . Esophageal cancer Neg Hx    History  Substance Use Topics  . Smoking status: Never Smoker   . Smokeless tobacco: Never Used  . Alcohol Use: No   OB  History    No data available     Review of Systems  Skin: Positive for rash.  All other systems reviewed and are negative.   Allergies  Review of patient's allergies indicates no known allergies.  Home Medications   Prior to Admission medications   Medication Sig Start Date End Date Taking? Authorizing Provider  amLODipine (NORVASC) 10 MG tablet Take 1 tablet (10 mg total) by mouth daily. 10/11/14   Ronnald NianJohn C Lalonde, MD  amoxicillin (AMOXIL) 875 MG tablet Take 1 tablet (875 mg total) by mouth 2 (two) times daily. 11/01/14   Graylon GoodZachary H Moe Brier, PA-C  cetirizine (ZYRTEC) 10 MG tablet Take 10 mg by mouth daily as needed for allergies.     Historical Provider, MD  doxycycline (VIBRAMYCIN) 100 MG capsule Take 1 capsule (100 mg total) by mouth 2 (two) times daily. 11/01/14   Adrian BlackwaterZachary H Loran Fleet, PA-C  fluticasone (FLONASE) 50 MCG/ACT nasal spray Place 2 sprays into both nostrils daily. 03/02/14 07/24/15  Ronnald NianJohn C Lalonde, MD  lisinopril-hydrochlorothiazide (PRINZIDE,ZESTORETIC) 20-25 MG per tablet Take 1 tablet by mouth daily. 10/11/14   Ronnald NianJohn C Lalonde, MD  omeprazole (PRILOSEC OTC) 20 MG tablet Take 20 mg by mouth 2 (two) times daily.    Historical Provider, MD  valACYclovir (VALTREX) 1000  MG tablet Take 1 tablet (1,000 mg total) by mouth 3 (three) times daily. 11/01/14 11/15/14  Adrian BlackwaterZachary H Roisin Mones, PA-C   BP 155/98 mmHg  Pulse 68  Temp(Src) 98.3 F (36.8 C) (Oral)  Resp 16  SpO2 100% Physical Exam  Constitutional: She is oriented to person, place, and time. Vital signs are normal. She appears well-developed and well-nourished. No distress.  HENT:  Head: Normocephalic and atraumatic.  Pulmonary/Chest: Effort normal. No respiratory distress.  Musculoskeletal:       Left upper arm: She exhibits tenderness (doral proximal medial upper arm - 12cmx8cm area of tedness, tenderness, induration, with a central group of pustules ) and swelling.  Neurological: She is alert and oriented to person, place, and  time. She has normal strength. Coordination normal.  Skin: Skin is warm and dry. No rash noted. She is not diaphoretic.  Psychiatric: She has a normal mood and affect. Judgment normal.  Nursing note and vitals reviewed.   ED Course  Procedures (including critical care time) Labs Review Labs Reviewed  WOUND CULTURE    Imaging Review No results found.   MDM   1. Shingles   2. Cellulitis of left upper extremity    She appears to have a secondarily infected shingles on her left arm. Treating for shingles and for cellulitis. She was extensively counseled on the potential serious nature of her diagnosis, she will go to the emergency department if worsening.   Meds ordered this encounter  Medications  . doxycycline (VIBRAMYCIN) 100 MG capsule    Sig: Take 1 capsule (100 mg total) by mouth 2 (two) times daily.    Dispense:  20 capsule    Refill:  0  . amoxicillin (AMOXIL) 875 MG tablet    Sig: Take 1 tablet (875 mg total) by mouth 2 (two) times daily.    Dispense:  14 tablet    Refill:  0  . valACYclovir (VALTREX) 1000 MG tablet    Sig: Take 1 tablet (1,000 mg total) by mouth 3 (three) times daily.    Dispense:  21 tablet    Refill:  0       Graylon GoodZachary H Tyaira Heward, PA-C 11/01/14 1038

## 2014-11-01 NOTE — Discharge Instructions (Signed)

## 2014-11-01 NOTE — ED Notes (Signed)
Pt started with tingling in her LUA about a week ago.  It began to itch and then she had bumps come up.  They have since started oozing from the scratching and have become very painful.

## 2014-11-03 LAB — WOUND CULTURE
Culture: NO GROWTH
Gram Stain: NONE SEEN

## 2014-11-08 ENCOUNTER — Telehealth: Payer: Self-pay | Admitting: Family Medicine

## 2014-11-08 NOTE — Telephone Encounter (Signed)
Called pt reached voice mail lmtrc needs ov follow up shingles.

## 2014-12-07 ENCOUNTER — Encounter: Payer: Self-pay | Admitting: Family Medicine

## 2014-12-07 ENCOUNTER — Ambulatory Visit (INDEPENDENT_AMBULATORY_CARE_PROVIDER_SITE_OTHER): Payer: Managed Care, Other (non HMO) | Admitting: Family Medicine

## 2014-12-07 VITALS — BP 130/80 | HR 76 | Wt 254.0 lb

## 2014-12-07 DIAGNOSIS — R109 Unspecified abdominal pain: Principal | ICD-10-CM

## 2014-12-07 DIAGNOSIS — R1012 Left upper quadrant pain: Secondary | ICD-10-CM

## 2014-12-07 DIAGNOSIS — G8929 Other chronic pain: Secondary | ICD-10-CM

## 2014-12-07 NOTE — Progress Notes (Signed)
   Subjective:    Patient ID: Debbie Gibson, female    DOB: 1976/09/02, 39 y.o.   MRN: [id number]  HPI she is here for evaluation of continued difficulty with intermittent left flank pain. She has a previous history of evaluation for this including blood work, CT scan of abdomen, GI evaluation. All these tests have been negative. The pain is intermittent in nature and usually in the left upper quadrant. She cannot relate this to eating, movement, breathing, urinary or GI symptoms. Nothing seems to make this better or worse. It is not really changed in the last 8-10 months. The pain usually lasts maybe 15-30 minutes.  Review of Systems     Objective:   Physical Exam Alert and in no distress. Urine dipstick is negative. Abdominal exam shows no hepatosplenomegaly masses or tenderness. Normal bowel sounds. Lungs are clear to auscultation. No chest wall tenderness.       Assessment & Plan:  Left flank pain, chronic - Plan: POCT Urinalysis Dipstick  Splane that at this point we have no idea what is causing this. She is to keep a record of when she has the pain and anything makes it better or worse. Explained the fact that since she's had this for at least 10 months and it has not progressed at this is actually a good sign indicating no major issue.

## 2014-12-07 NOTE — Patient Instructions (Signed)
The next time that you have this. Tension do anything makes it better or worse. Movement, breathing, eating, bowel habit, urinating. Try 2 aAeve and let me know how that helps

## 2015-07-04 ENCOUNTER — Ambulatory Visit (INDEPENDENT_AMBULATORY_CARE_PROVIDER_SITE_OTHER): Payer: Managed Care, Other (non HMO) | Admitting: Family Medicine

## 2015-07-04 ENCOUNTER — Encounter: Payer: Self-pay | Admitting: Family Medicine

## 2015-07-04 VITALS — BP 132/82 | HR 68 | Temp 97.8°F | Wt 264.8 lb

## 2015-07-04 DIAGNOSIS — R350 Frequency of micturition: Secondary | ICD-10-CM

## 2015-07-04 DIAGNOSIS — J301 Allergic rhinitis due to pollen: Secondary | ICD-10-CM

## 2015-07-04 DIAGNOSIS — J0140 Acute pansinusitis, unspecified: Secondary | ICD-10-CM | POA: Diagnosis not present

## 2015-07-04 LAB — POCT URINALYSIS DIPSTICK
Bilirubin, UA: NEGATIVE
Glucose, UA: NEGATIVE
Ketones, UA: NEGATIVE
Leukocytes, UA: NEGATIVE
NITRITE UA: NEGATIVE
Protein, UA: NEGATIVE
RBC UA: NEGATIVE
Spec Grav, UA: >=1.03
UROBILINOGEN UA: NEGATIVE
pH, UA: 6

## 2015-07-04 MED ORDER — AMOXICILLIN-POT CLAVULANATE 875-125 MG PO TABS: 1.0000 | ORAL_TABLET | Freq: Two times a day (BID) | ORAL | Status: DC

## 2015-07-04 NOTE — Patient Instructions (Signed)
HYdrate and rest as much as possible. Continue saline nasal irrigation and using your flonase. Let us know if you are not back to baseline after completing antibiotic. Also let us know if urinary get worse. Your urinalysis today was negative.   Sinusitis Sinusitis is redness, soreness, and inflammation of the paranasal sinuses. Paranasal sinuses are air pockets within the bones of your face (beneath the eyes, the middle of the forehead, or above the eyes). In healthy paranasal sinuses, mucus is able to drain out, and air is able to circulate through them by way of your nose. However, when your paranasal sinuses are inflamed, mucus and air can become trapped. This can allow bacteria and other germs to grow and cause infection. Sinusitis can develop quickly and last only a short time (acute) or continue over a long period (chronic). Sinusitis that lasts for more than 12 weeks is considered chronic.  CAUSES  Causes of sinusitis include:  Allergies.  Structural abnormalities, such as displacement of the cartilage that separates your nostrils (deviated septum), which can decrease the air flow through your nose and sinuses and affect sinus drainage.  Functional abnormalities, such as when the small hairs (cilia) that line your sinuses and help remove mucus do not work properly or are not present. SIGNS AND SYMPTOMS  Symptoms of acute and chronic sinusitis are the same. The primary symptoms are pain and pressure around the affected sinuses. Other symptoms include:  Upper toothache.  Earache.  Headache.  Bad breath.  Decreased sense of smell and taste.  A cough, which worsens when you are lying flat.  Fatigue.  Fever.  Thick drainage from your nose, which often is green and may contain pus (purulent).  Swelling and warmth over the affected sinuses. DIAGNOSIS  Your health care provider will perform a physical exam. During the exam, your health care provider may:  Look in your nose for  signs of abnormal growths in your nostrils (nasal polyps).  Tap over the affected sinus to check for signs of infection.  View the inside of your sinuses (endoscopy) using an imaging device that has a light attached (endoscope). If your health care provider suspects that you have chronic sinusitis, one or more of the following tests may be recommended:  Allergy tests.  Nasal culture. A sample of mucus is taken from your nose, sent to a lab, and screened for bacteria.  Nasal cytology. A sample of mucus is taken from your nose and examined by your health care provider to determine if your sinusitis is related to an allergy. TREATMENT  Most cases of acute sinusitis are related to a viral infection and will resolve on their own within 10 days. Sometimes medicines are prescribed to help relieve symptoms (pain medicine, decongestants, nasal steroid sprays, or saline sprays).  However, for sinusitis related to a bacterial infection, your health care provider will prescribe antibiotic medicines. These are medicines that will help kill the bacteria causing the infection.  Rarely, sinusitis is caused by a fungal infection. In theses cases, your health care provider will prescribe antifungal medicine. For some cases of chronic sinusitis, surgery is needed. Generally, these are cases in which sinusitis recurs more than 3 times per year, despite other treatments. HOME CARE INSTRUCTIONS   Drink plenty of water. Water helps thin the mucus so your sinuses can drain more easily.  Use a humidifier.  Inhale steam 3 to 4 times a day (for example, sit in the bathroom with the shower running).  Apply a warm,  moist washcloth to your face 3 to 4 times a day, or as directed by your health care provider.  Use saline nasal sprays to help moisten and clean your sinuses.  Take medicines only as directed by your health care provider.  If you were prescribed either an antibiotic or antifungal medicine, finish it all  even if you start to feel better. SEEK IMMEDIATE MEDICAL CARE IF:  You have increasing pain or severe headaches.  You have nausea, vomiting, or drowsiness.  You have swelling around your face.  You have vision problems.  You have a stiff neck.  You have difficulty breathing. MAKE SURE YOU:   Understand these instructions.  Will watch your condition.  Will get help right away if you are not doing well or get worse. Document Released: 10/29/2005 Document Revised: 03/15/2014 Document Reviewed: 11/13/2011 [facility] Patient Information 2015 [place], [place]. This information is not intended to replace advice given to you by your health care provider. Make sure you discuss any questions you have with your health care provider.

## 2015-07-04 NOTE — Progress Notes (Signed)
   Subjective:    Patient ID: Debbie Gibson, female    DOB: 06-Aug-1976, 39 y.o.   MRN: [id number]  HPI Pt complains of 10 day history of sinus pressure and congestion, ears itching, throat sore, drainage worse at night. Complains of clear thick mucous. Denies fever, chills, nausea, vomiting, cough, shortness of breath. Has used her flonase daily in the evening, also using neti pot. Patient states her symptoms are getting worse and she has generalized body aches. She is not a smoker. Also complains of urinary frequency for past week, denies dysuria, blood. Urine is darker yellow. Also complains of left low back pain that she has had in past with UTI. Denies abdominal pain.   Reviewed allergies, past medical, social history.  Review of Systems  Pertinent positives and negatives in the history of present illness, otherwise systems are negative.   Objective:   Physical Exam  Constitutional: She appears well-developed and well-nourished. No distress.  HENT:  Head: Normocephalic and atraumatic.  Right Ear: External ear and ear canal normal. Tympanic membrane is not erythematous and not bulging.  Left Ear: External ear and ear canal normal. Tympanic membrane is not erythematous and not bulging.  Nose: Nose normal.  Mouth/Throat: No oropharyngeal exudate.  Tenderness to maxillary and ethmoid sinuses.   Neck: Normal range of motion. Neck supple.  Pulmonary/Chest: Effort normal and breath sounds normal. She has no wheezes.  Abdominal: There is no CVA tenderness.  Lymphadenopathy:    She has no cervical adenopathy.    Urinalysis dipstick negative     Assessment & Plan:  Acute pansinusitis, recurrence not specified - Plan: amoxicillin-clavulanate (AUGMENTIN) 875-125 MG per tablet  Urinary frequency  Allergic rhinitis due to pollen  Hydrate and rest as much as possible for next couple of days. Continue using Flonase and nasal saline irrigation. Call us if you are not back to your  normal baseline after completing the antibiotic.

## 2015-11-07 ENCOUNTER — Other Ambulatory Visit: Payer: Self-pay | Admitting: Family Medicine

## 2016-02-09 ENCOUNTER — Encounter: Payer: Self-pay | Admitting: Obstetrics and Gynecology

## 2016-02-09 ENCOUNTER — Ambulatory Visit (INDEPENDENT_AMBULATORY_CARE_PROVIDER_SITE_OTHER): Payer: BC Managed Care – PPO | Admitting: Obstetrics and Gynecology

## 2016-02-09 VITALS — BP 144/70 | HR 88 | Resp 16 | Ht 67.25 in | Wt 269.0 lb

## 2016-02-09 DIAGNOSIS — Z124 Encounter for screening for malignant neoplasm of cervix: Secondary | ICD-10-CM | POA: Diagnosis not present

## 2016-02-09 DIAGNOSIS — N644 Mastodynia: Secondary | ICD-10-CM

## 2016-02-09 DIAGNOSIS — Z01419 Encounter for gynecological examination (general) (routine) without abnormal findings: Secondary | ICD-10-CM | POA: Diagnosis not present

## 2016-02-09 NOTE — Patient Instructions (Signed)

## 2016-02-09 NOTE — Progress Notes (Signed)
Patient ID: Debbie Gibson, female   DOB: 06-20-1976, 40 y.o.   MRN: [id number] 40 y.o. [id number] MarriedAfrican AmericanF here for annual exam.  Patient c/o bilateral breast pain. The pain has been intermittent for the last 3-4 months, more intense for the last 2-3 weeks. She is having pain in both breasts, worse in the left. Feels like its deep in the breast. She had an SAB (no d&c) in 2/17. The sensitivity from the pregnancy went away, BHCG went to negative. This pain is different than the pain from pregnancy, that was more at the nipple. She does feel that her breasts are getting better. She had a bra fitting in 12/16, feels that her breasts have gotten bigger.  She does breast self exams, she hasn't noticed any lumps, but isn't sure what she is feeling for. No galactorrhea. No caffeine.  She is thinking about trying to get pregnant again. She will talk to her primary about her blood pressure medications. She is on PNV.   Period Cycle (Days): 28 Period Duration (Days): 3-4 days Period Pattern: Regular Menstrual Flow: Moderate Menstrual Control: Maxi pad Dysmenorrhea: None  Patient's last menstrual period was 01/25/2016.          Sexually active: Yes.    The current method of family planning is condoms sometimes.    Exercising: Yes.    gym, cardio Smoker:  no  Health Maintenance: Pap:  Years ago History of abnormal Pap:  no MMG:  2011 WNL per patient  Colonoscopy:  Never BMD:   Never TDaP:  2012 Gardasil: N/A   reports that she has never smoked. She has never used smokeless tobacco. She reports that she does not drink alcohol or use illicit drugs.3 kids, 16, 14, 12. 2 step kids. Married x 5 years. She is a Runner, broadcasting/film/videoteacher, 4 th grade.   Past Medical History  Diagnosis Date  . Umbilical hernia 10/2012  . Hypertension     under control with meds., has been on med. x 2 yr.  . STD (sexually transmitted disease)     trichamonus  at 21  Past Surgical History  Procedure Laterality Date   . Wisdom tooth extraction      as a teenager  . Umbilical hernia repair  10/15/2012    Procedure: HERNIA REPAIR UMBILICAL ADULT;  Surgeon: Robyne AskewPaul S Toth III, MD;  Location: [facility];  Service: General;  Laterality: N/A;  umbilical hernia repair with mesh  . Insertion of mesh  10/15/2012    Procedure: INSERTION OF MESH;  Surgeon: Robyne AskewPaul S Toth III, MD;  Location: [facility];  Service: General;  Laterality: N/A;    Current Outpatient Prescriptions  Medication Sig Dispense Refill  . cetirizine (ZYRTEC) 10 MG tablet Take 10 mg by mouth daily as needed for allergies.     Marland Kitchen. omeprazole (PRILOSEC OTC) 20 MG tablet Take 20 mg by mouth 2 (two) times daily.    Marland Kitchen. amLODipine (NORVASC) 10 MG tablet TAKE ONE TABLET BY MOUTH DAILY (Patient not taking: Reported on 02/09/2016) 30 tablet 0  . fluticasone (FLONASE) 50 MCG/ACT nasal spray Place 2 sprays into both nostrils daily. 48 g 3  . lisinopril-hydrochlorothiazide (PRINZIDE,ZESTORETIC) 20-25 MG tablet TAKE ONE TABLET BY MOUTH  DAILY (Patient not taking: Reported on 02/09/2016) 30 tablet 0   No current facility-administered medications for this visit.    Family History  Problem Relation Age of Onset  . Breast cancer Maternal Aunt   . Diabetes Brother   .  Heart disease Father     CHF  . Hypertension Father   . Colon cancer Neg Hx   . Colon polyps Neg Hx   . Kidney disease Neg Hx   . Esophageal cancer Neg Hx   . Hypertension Mother   . Thyroid disease Mother     Review of Systems  Constitutional: Negative.   HENT: Negative.   Eyes: Negative.   Respiratory: Negative.   Cardiovascular: Negative.   Gastrointestinal: Negative.   Endocrine: Negative.   Genitourinary:       Bilateral breast pain   Musculoskeletal: Negative.   Skin: Negative.   Allergic/Immunologic: Negative.   Neurological: Negative.   Psychiatric/Behavioral: Negative.     Exam:   BP 144/70 mmHg  Pulse 88  Resp 16  Ht 5' 7.25" (1.708 m)  Wt  269 lb (122.018 kg)  BMI 41.83 kg/m2  LMP 01/25/2016  Weight change: @ Height:   Height: 5' 7.25" (170.8 cm)  Ht Readings from Last 3 Encounters:  02/09/16 5' 7.25" (1.708 m)  09/21/14  (1.702 m)  09/20/14 5' 7.5" (1.715 m)    General appearance: alert, cooperative and appears stated age Head: Normocephalic, without obvious abnormality, atraumatic Neck: no adenopathy, supple, symmetrical, trachea midline and thyroid normal to inspection and palpation Lungs: clear to auscultation bilaterally Breasts: normal appearance, no masses or tenderness Heart: regular rate and rhythm Abdomen: soft, non-tender; bowel sounds normal; no masses,  no organomegaly Extremities: extremities normal, atraumatic, no cyanosis or edema Skin: Skin color, texture, turgor normal. No rashes or lesions Lymph nodes: Cervical, supraclavicular, and axillary nodes normal. No abnormal inguinal nodes palpated Neurologic: Grossly normal   Pelvic: External genitalia:  no lesions              Urethra:  normal appearing urethra with no masses, tenderness or lesions              Bartholins and Skenes: normal                 Vagina: normal appearing vagina with normal color and discharge, no lesions              Cervix: no lesions               Bimanual Exam:  Uterus:  normal size, contour, position, consistency, mobility, non-tender              Adnexa: no mass, fullness, tenderness               Rectovaginal: Confirms               Anus:  normal sphincter tone, no lesions  Chaperone was present for exam.  A:  Well Woman with normal exam  Mastalgia, normal exam, she feels her bra's are too tight. No caffeine intake.  Considering pregnancy  P:   F/u with her primary for lab work prior to pregnancy (ie CMP, lipids) and to change anti-hypertensive medication  Continue PNV  Pap with hpv  Try a new bra, if not improving in the next few weeks will set up diagnostic imaging  Discussed breast self  exam  Discussed calcium and vit D intake    CC: Dr Sharlot Gowda

## 2016-02-15 NOTE — Addendum Note (Signed)
Addended by: Tobi BastosJERTSON, Kaan Tosh E on: 02/15/2016 09:27 AM   Modules accepted: Orders

## 2016-02-17 LAB — IPS PAP TEST WITH HPV

## 2016-02-20 ENCOUNTER — Telehealth: Payer: Self-pay

## 2016-02-20 MED ORDER — METRONIDAZOLE 500 MG PO TABS: 500.0000 mg | ORAL_TABLET | Freq: Two times a day (BID) | ORAL | Status: DC

## 2016-02-20 NOTE — Telephone Encounter (Signed)
-----   Message from Romualdo BolkJill Evelyn Jertson, MD sent at 02/20/2016  9:58 AM EDT ----- Please inform negative pap and hpv, +for BV. If symptomatic treat with flagyl 500 mg BID x 7 days or metrogel, 1 applicator qhs x 5 days. Doesn't need to be treated if not symptomatic.   02

## 2016-02-20 NOTE — Telephone Encounter (Signed)
Spoke with patient. Advised of message as seen below from Dr.Jertson. Patient states that she has been experiencing a vaginal discharge over the last week. Would like to take Flagyl at this time for treatment of BV. ETOH precautions given. Rx for Flagyl 500 mg bid x 7 days #14 0RF sent to pharmacy on file.  Routing to provider for final review. Patient agreeable to disposition. Will close encounter.

## 2016-02-27 ENCOUNTER — Telehealth: Payer: Self-pay | Admitting: *Deleted

## 2016-02-27 DIAGNOSIS — N644 Mastodynia: Secondary | ICD-10-CM

## 2016-02-27 NOTE — Telephone Encounter (Signed)
Debbie BolkJill Evelyn Jertson, MD  Sent: Caleen EssexFri February 10, 2016 9:50 AM   To: Lorri FrederickMartha E Miliyah Luper, CMA     Message    Please check on her in 2 weeks and make sure her breast tenderness has resolved, otherwise she needs diagnostic imaging.    02-27-16 LMTC in regards to message above -eh

## 2016-02-29 NOTE — Telephone Encounter (Signed)
Patient is returning a call to Elaine. °

## 2016-02-29 NOTE — Telephone Encounter (Signed)
Patient is still having the breast tenderness. I let her know that we would be contacting her to set up diagnostic imagining.   NOTE: patient is a Engineer, siteschool teacher. She asked that we call and leave a message and she will call back on her planning period at 11am.

## 2016-02-29 NOTE — Telephone Encounter (Signed)
Dr. Oscar LaJertson,  Will patient need bilateral diagnostic mammogram and bilateral ultrasounds scheduled?

## 2016-02-29 NOTE — Telephone Encounter (Signed)
Yes please

## 2016-03-01 NOTE — Telephone Encounter (Signed)
Orders placed for Diagnostic Bilateral Breast Imaging.  Message left to return call to Tananaracy at [phone] to discuss scheduling preferences.

## 2016-03-01 NOTE — Telephone Encounter (Signed)
Patient returned call. She is agreeable to scheduling diagnostic breast imaging.  She will use [facility] imaging.  She is advised that diagnostic bilateral mammogram and bilateral ultrasounds are ordered, however, radiologist will review mammogram results and ultrasound will be completed at radiologist discretion. Patient agreeable.  She is advised to call back with any concerns and will call [facility] imaging to schedule appointment for diagnostic imaging.  Reviewed chart, patient has scheduled for 03/05/16 at 1440.  Imaging hold placed.  Will close encounter.

## 2016-03-02 ENCOUNTER — Telehealth: Payer: Self-pay | Admitting: Medical

## 2016-03-02 DIAGNOSIS — Z0279 Encounter for issue of other medical certificate: Secondary | ICD-10-CM

## 2016-03-02 NOTE — Telephone Encounter (Signed)
[facility] sign[facility] Records faxed to [phone]

## 2016-03-05 ENCOUNTER — Ambulatory Visit
Admission: RE | Admit: 2016-03-05 | Discharge: 2016-03-05 | Disposition: A | Discharge status: Home / Self Care | Payer: BC Managed Care – PPO | Source: Ambulatory Visit | Attending: Obstetrics and Gynecology | Admitting: Obstetrics and Gynecology

## 2016-03-05 DIAGNOSIS — N644 Mastodynia: Secondary | ICD-10-CM

## 2016-03-08 ENCOUNTER — Telehealth: Payer: Self-pay | Admitting: *Deleted

## 2016-03-08 NOTE — Telephone Encounter (Signed)
-----   Message from Romualdo BolkJill Evelyn Jertson, MD sent at 03/08/2016  2:12 PM EDT ----- Her imaging was normal. Please have her come back for a f/u breast check in 6 weeks. Make sure she has gotten a properly fitting bra. She can try vit E 400 units a day and can try Evening Primrose oil (follow directions on bottle), that can help with breast pain.

## 2016-03-08 NOTE — Telephone Encounter (Signed)
03-08-16 LMTC in regards to results-eh 

## 2016-03-14 NOTE — Telephone Encounter (Signed)
Left another message for patient to return call-eh 

## 2016-03-28 ENCOUNTER — Telehealth: Payer: Self-pay | Admitting: Obstetrics and Gynecology

## 2016-03-28 ENCOUNTER — Encounter: Payer: Self-pay | Admitting: *Deleted

## 2016-03-28 NOTE — Telephone Encounter (Signed)
Patient said she is returning a call to HulbertElaine.

## 2016-03-28 NOTE — Telephone Encounter (Signed)
A letter sent to patient -see result note -eh

## 2016-03-29 ENCOUNTER — Telehealth: Payer: Self-pay | Admitting: Obstetrics and Gynecology

## 2016-03-29 NOTE — Telephone Encounter (Signed)
LMTC again -eh

## 2016-03-29 NOTE — Telephone Encounter (Signed)
Patient is aware of her results. She is set up for a follow up on 04/19/16 for a breast recheck -eh

## 2016-03-29 NOTE — Telephone Encounter (Signed)
Patient returning call. Please reference telephone note from 03/08/16.

## 2016-03-29 NOTE — Telephone Encounter (Signed)
LMTC again- letter was sent earlier this week. -eh

## 2016-04-19 ENCOUNTER — Encounter: Payer: Self-pay | Admitting: Obstetrics and Gynecology

## 2016-04-19 ENCOUNTER — Ambulatory Visit (INDEPENDENT_AMBULATORY_CARE_PROVIDER_SITE_OTHER): Payer: BC Managed Care – PPO | Admitting: Obstetrics and Gynecology

## 2016-04-19 VITALS — BP 140/88 | HR 80 | Resp 16 | Ht 67.25 in | Wt 268.0 lb

## 2016-04-19 DIAGNOSIS — N644 Mastodynia: Secondary | ICD-10-CM | POA: Diagnosis not present

## 2016-04-19 DIAGNOSIS — R071 Chest pain on breathing: Secondary | ICD-10-CM | POA: Diagnosis not present

## 2016-04-19 DIAGNOSIS — R0789 Other chest pain: Secondary | ICD-10-CM

## 2016-04-19 MED ORDER — NAPROXEN SODIUM 550 MG PO TABS: ORAL_TABLET | ORAL | Status: DC

## 2016-04-19 MED ORDER — CYCLOBENZAPRINE HCL 5 MG PO TABS: ORAL_TABLET | ORAL | Status: DC

## 2016-04-19 NOTE — Progress Notes (Signed)
GYNECOLOGY  VISIT   HPI: 40 y.o.   Married  [place]  [place]   [id number] with Patient's last menstrual period was 03/16/2016.   here for L breast pain in axillary area. The patient was seen at the end of March with bilateral breast pain. Imaging was normal. She has had persistent pain in her left breast, right breast pain has improved. She has constant pain in the upper outer left breast, achy, sometimes it extends to her nipple. Pain ranges from a 5-9/10 in severity. Hot showers calms the pain some. She got fitted for new bra's. No help.  Of note she does have a tear in her left shoulder.   GYNECOLOGIC HISTORY: Patient's last menstrual period was 03/16/2016. Contraception: none Menopausal hormone therapy: none         OB History    Gravida Para Term Preterm AB TAB SAB Ectopic Multiple Living   5 3 3  2  1   3          Patient Active Problem List   Diagnosis Date Noted  . Obesity (BMI 30-39.9) 10/11/2014  . Umbilical hernia 09/22/2012  . Essential hypertension, benign 12/07/2011  . Allergic rhinitis due to pollen 08/10/2011    Past Medical History  Diagnosis Date  . Umbilical hernia 10/2012  . Hypertension     under control with meds., has been on med. x 2 yr.  . STD (sexually transmitted disease)     trichamonus    Past Surgical History  Procedure Laterality Date  . Wisdom tooth extraction      as a teenager  . Umbilical hernia repair  10/15/2012    Procedure: HERNIA REPAIR UMBILICAL ADULT;  Surgeon: Robyne AskewPaul S Toth III, MD;  Location: [facility];  Service: General;  Laterality: N/A;  umbilical hernia repair with mesh  . Insertion of mesh  10/15/2012    Procedure: INSERTION OF MESH;  Surgeon: Robyne AskewPaul S Toth III, MD;  Location: [facility];  Service: General;  Laterality: N/A;    Current Outpatient Prescriptions  Medication Sig Dispense Refill  . amLODipine (NORVASC) 10 MG tablet TAKE ONE TABLET BY MOUTH DAILY 30 tablet 0  . cetirizine  (ZYRTEC) 10 MG tablet Take 10 mg by mouth daily as needed for allergies.     Marland Kitchen. lisinopril-hydrochlorothiazide (PRINZIDE,ZESTORETIC) 20-25 MG tablet TAKE ONE TABLET BY MOUTH  DAILY 30 tablet 0  . omeprazole (PRILOSEC OTC) 20 MG tablet Take 20 mg by mouth 2 (two) times daily.    . fluticasone (FLONASE) 50 MCG/ACT nasal spray Place 2 sprays into both nostrils daily. 48 g 3   No current facility-administered medications for this visit.     ALLERGIES: Peanut-containing drug products and Shellfish allergy  Family History  Problem Relation Age of Onset  . Breast cancer Maternal Aunt   . Diabetes Brother   . Heart disease Father     CHF  . Hypertension Father   . Colon cancer Neg Hx   . Colon polyps Neg Hx   . Kidney disease Neg Hx   . Esophageal cancer Neg Hx   . Hypertension Mother   . Thyroid disease Mother     Social History   Social History  . Marital Status: Married    Spouse Name: N/A  . Number of Children: 3  . Years of Education: N/A   Occupational History  . [employer]   Social History Main Topics  . Smoking status: Never Smoker   .  Smokeless tobacco: Never Used  . Alcohol Use: No  . Drug Use: No  . Sexual Activity:    Partners: Male    Birth Control/ Protection: Condom     Comment: denies pregnancy now   Other Topics Concern  . Not on file   Social History Narrative   Lives with husband and 2 kids (son, daughter).  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   HENT: Negative.   Eyes: Negative.   Respiratory: Negative.   Cardiovascular: Negative.   Gastrointestinal: Negative.   Genitourinary:       L Breast pain.   Musculoskeletal: Negative.   Skin: Negative.   Neurological: Negative.   Endo/Heme/Allergies: Negative.   Psychiatric/Behavioral: Negative.     PHYSICAL EXAMINATION:    BP 140/88 mmHg  Pulse 80  Resp 16  Ht 5' 7.25" (1.708 m)  Wt  268 lb (121.564 kg)  BMI 41.67 kg/m2  LMP 03/16/2016    General appearance: alert, cooperative and appears stated age Breasts: no lumps dimpling or adenopathy. Very tender with palpation of the left lateral, upper breast, I think it is her chest wall that is tender No axillary or supraclavicular adenopathy  ASSESSMENT Suspect she has costochondritis not mastalgia. Her left shoulder issues may be contributing She has had negative breast imaging    PLAN Will try NSAID's (short term), flexeril (mainly at bedtime), hot and cold compresses, she can also try OTC capsaicin cream topically If she doesn't improve she can try vit e and evening primrose oil which can be helpful for mastalgia If she continues to have pain, will refer her to her primary.    An After Visit Summary was printed and given to the patient.  Over 15 minutes face to face time of which over 50% was spent in counseling.

## 2016-04-19 NOTE — Patient Instructions (Signed)
Breast Tenderness Breast tenderness is a common problem for women of all ages. Breast tenderness may cause mild discomfort to severe pain. It has a variety of causes. Your health care provider will find out the likely cause of your breast tenderness by examining your breasts, asking you about symptoms, and ordering some tests. Breast tenderness usually does not mean you have breast cancer. HOME CARE INSTRUCTIONS  Breast tenderness often can be handled at home. You can try:  Getting fitted for a new bra that provides more support, especially during exercise.  Wearing a more supportive bra or sports bra while sleeping when your breasts are very tender.  If you have a breast injury, apply ice to the area:  Put ice in a plastic bag.  Place a towel between your skin and the bag.  Leave the ice on for 20 minutes, 2-3 times a day.  If your breasts are too full of milk as a result of breastfeeding, try:  Expressing milk either by hand or with a breast pump.  Applying a warm compress to the breasts for relief.  Taking over-the-counter pain relievers, if approved by your health care provider.  Taking other medicines that your health care provider prescribes. These may include antibiotic medicines or birth control pills. Over the long term, your breast tenderness might be eased if you:  Cut down on caffeine  Reduce the amount of fat in your diet. Keep a log of the days and times when your breasts are most tender. This will help you and your health care provider find the cause of the tenderness and how to relieve it. Also, learn how to do breast exams at home. This will help you notice if you have an unusual growth or lump that could cause tenderness. SEEK MEDICAL CARE IF:   Any part of your breast is hard, red, and hot to the touch. This could be a sign of infection.  Fluid is coming out of your nipples (and you are not breastfeeding). Especially watch for blood or pus.  You have a fever  as well as breast tenderness.  You have a new or painful lump in your breast that remains after your menstrual period ends.  You have tried to take care of the pain at home, but it has not gone away.  Your breast pain is getting worse, or the pain is making it hard to do the things you usually do during your day.   This information is not intended to replace advice given to you by your health care provider. Make sure you discuss any questions you have with your health care provider.   Document Released: 10/11/2008 Document Revised: 07/01/2013 Document Reviewed: 05/28/2013 Elsevier Interactive Patient Education 2016 [employer] Costochondritis Costochondritis, sometimes called Tietze syndrome, is a swelling and irritation (inflammation) of the tissue (cartilage) that connects your ribs with your breastbone (sternum). It causes pain in the chest and rib area. Costochondritis usually goes away on its own over time. It can take up to 6 weeks or longer to get better, especially if you are unable to limit your activities. CAUSES  Some cases of costochondritis have no known cause. Possible causes include:  Injury (trauma).  Exercise or activity such as lifting.  Severe coughing. SIGNS AND SYMPTOMS  Pain and tenderness in the chest and rib area.  Pain that gets worse when coughing or taking deep breaths.  Pain that gets worse with specific movements. DIAGNOSIS  Your health care provider will do a physical  exam and ask about your symptoms. Chest X-rays or other tests may be done to rule out other problems. TREATMENT  Costochondritis usually goes away on its own over time. Your health care provider may prescribe medicine to help relieve pain. HOME CARE INSTRUCTIONS   Avoid exhausting physical activity. Try not to strain your ribs during normal activity. This would include any activities using chest, abdominal, and side muscles, especially if heavy weights are used.  Apply ice to the  affected area for the first 2 days after the pain begins.  Put ice in a plastic bag.  Place a towel between your skin and the bag.  Leave the ice on for 20 minutes, 2-3 times a day.  Only take over-the-counter or prescription medicines as directed by your health care provider. SEEK MEDICAL CARE IF:  You have redness or swelling at the rib joints. These are signs of infection.  Your pain does not go away despite rest or medicine. SEEK IMMEDIATE MEDICAL CARE IF:   Your pain increases or you are very uncomfortable.  You have shortness of breath or difficulty breathing.  You cough up blood.  You have worse chest pains, sweating, or vomiting.  You have a fever or persistent symptoms for more than 2-3 days.  You have a fever and your symptoms suddenly get worse. MAKE SURE YOU:   Understand these instructions.  Will watch your condition.  Will get help right away if you are not doing well or get worse.   This information is not intended to replace advice given to you by your health care provider. Make sure you discuss any questions you have with your health care provider.   Document Released: 08/08/2005 Document Revised: 08/19/2013 Document Reviewed: 06/02/2013 Elsevier Interactive Patient Education 2016 [employer]  You can try OTC Capsaicin cream topically with the anaprox If treatment of musculoskeletal pain doesn't help, you can try using vit E and evening primrose oil, these can be helpful for breast pain

## 2016-05-29 ENCOUNTER — Telehealth: Payer: Self-pay | Admitting: Family Medicine

## 2016-05-29 ENCOUNTER — Telehealth: Payer: Self-pay | Admitting: Emergency Medicine

## 2016-05-29 ENCOUNTER — Other Ambulatory Visit: Payer: Self-pay

## 2016-05-29 MED ORDER — LISINOPRIL-HYDROCHLOROTHIAZIDE 20-25 MG PO TABS: ORAL_TABLET | ORAL | Status: DC

## 2016-05-29 MED ORDER — AMLODIPINE BESYLATE 10 MG PO TABS: 10.0000 mg | ORAL_TABLET | Freq: Every day | ORAL | Status: DC

## 2016-05-29 NOTE — Telephone Encounter (Signed)
Pt made [facility] appt on 06/14/16. Requesting refill on Amlodipine 10 mg & Lisinopril 20-25 mg to last until appt

## 2016-05-29 NOTE — Telephone Encounter (Signed)
Sent 30 days in  

## 2016-05-29 NOTE — Telephone Encounter (Signed)
Call to patient to follow up on Breast Pain that she was seen in office for on 04/19/16 by Dr. Oscar LaJertson:  Calling to check status and see if pain has resolved.  Message left to return call to [place] at [phone].     Suspect she has costochondritis not mastalgia. Her left shoulder issues may be contributing She has had negative breast imaging Will try NSAID's (short term), flexeril (mainly at bedtime), hot and cold compresses, she can also try OTC capsaicin cream topically If she doesn't improve she can try vit e and evening primrose oil which can be helpful for mastalgia If she continues to have pain, will refer her to her primary.

## 2016-06-14 ENCOUNTER — Encounter: Payer: BC Managed Care – PPO | Admitting: Family Medicine

## 2016-06-15 ENCOUNTER — Encounter: Payer: Self-pay | Admitting: Family Medicine

## 2016-06-15 ENCOUNTER — Ambulatory Visit (INDEPENDENT_AMBULATORY_CARE_PROVIDER_SITE_OTHER): Payer: BC Managed Care – PPO | Admitting: Family Medicine

## 2016-06-15 VITALS — BP 130/70 | HR 80 | Ht 67.0 in | Wt 273.2 lb

## 2016-06-15 DIAGNOSIS — E669 Obesity, unspecified: Secondary | ICD-10-CM

## 2016-06-15 DIAGNOSIS — J301 Allergic rhinitis due to pollen: Secondary | ICD-10-CM

## 2016-06-15 DIAGNOSIS — I10 Essential (primary) hypertension: Secondary | ICD-10-CM

## 2016-06-15 LAB — CBC WITH DIFFERENTIAL/PLATELET
BASOS ABS: 0 {cells}/uL (ref 0–200)
Basophils Relative: 0 %
EOS PCT: 1 %
Eosinophils Absolute: 60 cells/uL (ref 15–500)
HCT: 37.5 % (ref 35.0–45.0)
Hemoglobin: 12.3 g/dL (ref 11.7–15.5)
LYMPHS ABS: 2040 {cells}/uL (ref 850–3900)
LYMPHS PCT: 34 %
MCH: 26.2 pg — AB (ref 27.0–33.0)
MCHC: 32.8 g/dL (ref 32.0–36.0)
MCV: 80 fL (ref 80.0–100.0)
MONO ABS: 480 {cells}/uL (ref 200–950)
MONOS PCT: 8 %
MPV: 10.1 fL (ref 7.5–12.5)
NEUTROS PCT: 57 %
Neutro Abs: 3420 cells/uL (ref 1500–7800)
PLATELETS: 253 10*3/uL (ref 140–400)
RBC: 4.69 MIL/uL (ref 3.80–5.10)
RDW: 16 % — ABNORMAL HIGH (ref 11.0–15.0)
WBC: 6 10*3/uL (ref 4.0–10.5)

## 2016-06-15 LAB — COMPREHENSIVE METABOLIC PANEL
ALT: 11 U/L (ref 6–29)
AST: 13 U/L (ref 10–30)
Albumin: 4.2 g/dL (ref 3.6–5.1)
Alkaline Phosphatase: 70 U/L (ref 33–115)
BUN: 8 mg/dL (ref 7–25)
CHLORIDE: 99 mmol/L (ref 98–110)
CO2: 26 mmol/L (ref 20–31)
CREATININE: 0.92 mg/dL (ref 0.50–1.10)
Calcium: 9.5 mg/dL (ref 8.6–10.2)
GLUCOSE: 83 mg/dL (ref 65–99)
Potassium: 3.8 mmol/L (ref 3.5–5.3)
SODIUM: 138 mmol/L (ref 135–146)
Total Bilirubin: 0.4 mg/dL (ref 0.2–1.2)
Total Protein: 8.3 g/dL — ABNORMAL HIGH (ref 6.1–8.1)

## 2016-06-15 LAB — LIPID PANEL
Cholesterol: 207 mg/dL — ABNORMAL HIGH (ref 125–200)
HDL: 73 mg/dL (ref >=46)
LDL CALC: 120 mg/dL (ref <130)
Total CHOL/HDL Ratio: 2.8 Ratio (ref <=5.0)
Triglycerides: 72 mg/dL (ref <150)
VLDL: 14 mg/dL (ref <30)

## 2016-06-15 MED ORDER — LISINOPRIL-HYDROCHLOROTHIAZIDE 20-25 MG PO TABS: ORAL_TABLET | ORAL | 0 refills | Status: DC

## 2016-06-15 MED ORDER — AMLODIPINE BESYLATE 10 MG PO TABS: 10.0000 mg | ORAL_TABLET | Freq: Every day | ORAL | 0 refills | Status: DC

## 2016-06-15 NOTE — Progress Notes (Signed)
   Subjective:    Patient ID: Debbie Gibson, female    DOB: 1976/02/08, 40 y.o.   MRN: [id number]  HPI She is here for a med check. She states in the last several months sh lifestyle chanot seen any change in her weight. This has her slightly frustrated. She does have underlying allergies and is doing well with them. She continues on her blood pressure medications and again is having no trouble. Her work and home life are going quite well. Social history was Reviewed. She does get regular gynecologic evaluations.  Review of Systems     Objective:   Physical Exam Alert and in no distress. Tympanic membranes and canals are normal. Pharyngeal area is normal. Neck is supple without adenopathy or thyromegaly. Cardiac exam shows a regular sinus rhythm without murmurs or gallops. Lungs are clear to auscultation.        Assessment & Plan:  Essential hypertension, benign - Plan: lisinopril-hydrochlorothiazide (PRINZIDE,ZESTORETIC) 20-25 MG tablet, amLODipine (NORVASC) 10 MG tablet, CBC with Differential/Platelet, Comprehensive metabolic panel, Lipid panel  Allergic rhinitis due to pollen, unspecified rhinitis seasonality  Obesity (BMI 30-39.9) - Plan: CBC with Differential/Platelet, Comprehensive metabolic panel, Lipid panel, Amb ref to Medical Nutrition Therapy-MNT  Discussed the fact that if the visit with the t does not result adequate weight reduction, I will consider a weight loss medication.

## 2016-08-20 ENCOUNTER — Encounter: Payer: Self-pay | Admitting: Family Medicine

## 2016-08-20 ENCOUNTER — Ambulatory Visit (INDEPENDENT_AMBULATORY_CARE_PROVIDER_SITE_OTHER): Payer: BC Managed Care – PPO | Admitting: Family Medicine

## 2016-08-20 VITALS — BP 124/80 | HR 86 | Temp 98.8°F | Wt 272.6 lb

## 2016-08-20 DIAGNOSIS — I10 Essential (primary) hypertension: Secondary | ICD-10-CM | POA: Diagnosis not present

## 2016-08-20 DIAGNOSIS — J0140 Acute pansinusitis, unspecified: Secondary | ICD-10-CM | POA: Diagnosis not present

## 2016-08-20 MED ORDER — LISINOPRIL-HYDROCHLOROTHIAZIDE 20-25 MG PO TABS: ORAL_TABLET | ORAL | 12 refills | Status: DC

## 2016-08-20 MED ORDER — AMOXICILLIN 875 MG PO TABS: 875.0000 mg | ORAL_TABLET | Freq: Two times a day (BID) | ORAL | 0 refills | Status: DC

## 2016-08-20 MED ORDER — AMLODIPINE BESYLATE 10 MG PO TABS: 10.0000 mg | ORAL_TABLET | Freq: Every day | ORAL | 12 refills | Status: DC

## 2016-08-20 NOTE — Patient Instructions (Signed)

## 2016-08-20 NOTE — Progress Notes (Signed)
She Subjective:  Debbie Gibson is a 40 y.o. female who presents for possible sinus infection.  Symptoms include maxillary sinus pressure and pain for approximately 10 days.She  also reports that she is smelling cigarette smoke even though no one is smoking near her.  Denies fever, chills,ear pain, sore throat, nausea, vomiting, diarrhea.   Past history is significant for no history of pneumonia or bronchitis. Patient is a non-smoker.  Using coricidan and neti pot for symptoms.  Denies sick contacts.  No other aggravating or relieving factors.  No other c/o.  Requests refill on blood pressure medications. States she had a recent appointment with Dr. Susann GivensLalonde for this.   ROS as in subjective   Objective: Vitals:   08/20/16 1617  BP: 124/80  Pulse: 86  Temp: 98.8 F (37.1 C)    General appearance: Alert, WD/WN, no distress                             Skin: warm, no rash                           Head: + marked frontal and maxillary sinus tenderness,                            Eyes: conjunctiva normal, corneas clear, PERRLA                            Ears: pearly TMs, external ear canals normal                          Nose: septum midline, turbinates swollen, with erythema and clear discharge             Mouth/throat: MMM, tongue normal, mild pharyngeal erythema                           Neck: supple, no adenopathy, no thyromegaly, nontender                          Heart: RRR, normal S1, S2, no murmurs                         Lungs: CTA bilaterally, no wheezes, rales, or rhonchi      Assessment and Plan:  Acute pansinusitis, recurrence not specified - Plan: amoxicillin (AMOXIL) 875 MG tablet  Essential hypertension, benign - Plan: lisinopril-hydrochlorothiazide (PRINZIDE,ZESTORETIC) 20-25 MG tablet, amLODipine (NORVASC) 10 MG tablet  She appears to have an acute sinusitis. Suspect her distorted sense of smell is due to sinus infection.  Prescription sent for Amoxicillin   Can  use OTC Mucinex for congestion.  Tylenol or Ibuprofen OTC for fever and malaise.  Discussed symptomatic relief, nasal saline flush, and call or return if not back to baseline after completing the antibiotic.  Refilled medications for HTN. She had a recent appointment with Dr. Susann GivensLalonde.

## 2016-08-31 NOTE — Telephone Encounter (Signed)
Left message to call Kaitlyn at [phone].  Following up from telephone call. Want to ensure breast pain has resolved.

## 2016-09-18 NOTE — Telephone Encounter (Signed)
Dr.Jertson, patient has been contacted x 2 to ensure her breast pain has resolved without return call. Please advise next steps.

## 2016-09-19 NOTE — Telephone Encounter (Signed)
Please send her a letter telling her we have been unable to reach her by phone and asking her to call if she is still having breast pain and close the encounter.

## 2016-09-19 NOTE — Telephone Encounter (Signed)
Letter to Dr.Jertson for review and signature before mailing. 

## 2016-12-05 ENCOUNTER — Encounter: Payer: Self-pay | Admitting: Family Medicine

## 2016-12-05 ENCOUNTER — Ambulatory Visit (INDEPENDENT_AMBULATORY_CARE_PROVIDER_SITE_OTHER): Payer: BC Managed Care – PPO | Admitting: Family Medicine

## 2016-12-05 VITALS — BP 132/90 | HR 76 | Wt 265.6 lb

## 2016-12-05 DIAGNOSIS — H6500 Acute serous otitis media, unspecified ear: Secondary | ICD-10-CM

## 2016-12-05 DIAGNOSIS — R42 Dizziness and giddiness: Secondary | ICD-10-CM | POA: Diagnosis not present

## 2016-12-05 NOTE — Patient Instructions (Signed)
Use the meclizine regularly and also Afrin nasal spray twice a day for the next 3 or 4 days. Hold off on taking the Augmentin . Keep using the steroid

## 2016-12-05 NOTE — Progress Notes (Signed)
   Subjective:    Patient ID: Debbie Gibson, female    DOB: 1976/04/24, 41 y.o.   MRN: [id number]  HPI She is here for follow-up after eating seen in an urgent care yesterday. She was placed on meclizine 25 3 times a day, Zofran and Augmentin as well as prednisone. She was apparently told she had a inner ear infection. He has noted difficulty with hearing especially from the left ear during this same timeframe. She's not been able to pop her ears. She is also concerned about her blood pressure which seems to have gone up and down and its relation to her dizziness and slight nausea. She also continues to be lightheaded. She does have underlying allergies and presently is on Zyrtec.   Review of Systems     Objective:   Physical Exam Alert and in no distress. Tympanic membranes and canals are normal. Pharyngeal area is normal. Neck is supple without adenopathy or thyromegaly. Cardiac exam shows a regular sinus rhythm without murmurs or gallops. Lungs are clear to auscultation.        Assessment & Plan:  Acute serous otitis media, recurrence not specified, unspecified laterality  Vertigo Although her exam including the tympanic membranes appears normal, her symptoms do sound like eustachian tube dysfunction. Recommended she continue to use the meclizine for the dizziness, Zofran as needed. She can stop the Augmentin but continue with the prednisone. Also recommended using Afrin nasal spray for the next 3 or 4 days. I reassured her that none of the symptoms were really blood pressure related.

## 2016-12-07 ENCOUNTER — Telehealth: Payer: Self-pay

## 2016-12-07 NOTE — Telephone Encounter (Signed)
Pt reports that she was started on meclizine, prednisone on Wednesday, she is having vomiting and diarrhea. Wonders if this is a side effect from medication. Reports left hand tingling and numbness- is this side effects as well. All sx started Thursday am. CB #  [id number]. Trixie Rude/RLB

## 2016-12-07 NOTE — Telephone Encounter (Signed)
The symptoms are probably not from the meds. Probably virus related.Treeat symptomatically

## 2016-12-07 NOTE — Telephone Encounter (Signed)
Pt was notified.  

## 2017-01-07 ENCOUNTER — Ambulatory Visit: Payer: BC Managed Care – PPO | Admitting: Family Medicine

## 2017-01-11 ENCOUNTER — Encounter: Payer: Self-pay | Admitting: Family Medicine

## 2017-01-22 ENCOUNTER — Encounter: Payer: Self-pay | Admitting: Family Medicine

## 2017-01-22 ENCOUNTER — Ambulatory Visit (INDEPENDENT_AMBULATORY_CARE_PROVIDER_SITE_OTHER): Payer: BC Managed Care – PPO | Admitting: Family Medicine

## 2017-01-22 VITALS — BP 130/90 | HR 67 | Temp 98.3°F | Wt 270.8 lb

## 2017-01-22 DIAGNOSIS — J020 Streptococcal pharyngitis: Secondary | ICD-10-CM | POA: Diagnosis not present

## 2017-01-22 MED ORDER — AMOXICILLIN-POT CLAVULANATE 875-125 MG PO TABS: 1.0000 | ORAL_TABLET | Freq: Two times a day (BID) | ORAL | 0 refills | Status: DC

## 2017-01-22 NOTE — Progress Notes (Signed)
Subjective: Chief Complaint  Patient presents with  . sick    sinuses- over a week ago, at night sore throat due to drainage but cough up green mucous    Debbie Gibson is a 41 y.o. female who presents for an acute illness. Complains of a week history of sore throat and post nasal drainage. Also reports coughing up greenish phlegm in the mornings. Her symptoms were proceeded by a 10-14 day history of sinus pressure and rhinorrhea. States she took a course of Amoxicillin and finished it one week ago. States sinus symptoms cleared up.   Denies fever, chills, body aches, rhinorrhea, nasal congestion, ear pain, chest pain, shortness of breath, abdominal pain, N/V/D.   She has underlying allergies and takes daily Zyrtec and Flonase.  Does not smoke. No history of asthma or lung disease.   Treatment to date: antibiotics.  Positive sick contacts- teaches school.  No other aggravating or relieving factors.  No other c/o.  ROS as in subjective.   Objective: Vitals:   01/22/17 0951  BP: 130/90  Pulse: 67  Temp: 98.3 F (36.8 C)    General appearance: Alert, WD/WN, no distress, mildly ill appearing                             Skin: warm, no rash                           Head: no sinus tenderness                            Eyes: conjunctiva normal, corneas clear, PERRLA                            Ears: pearly TMs, external ear canals normal                          Nose: septum midline, nares patent, without erythema or discharge             Mouth/throat: MMM, tongue normal, moderate pharyngeal erythema, mild edema, no exudate.                            Neck: supple, no adenopathy, no thyromegaly, non tender, no stridor                          Heart: RRR, normal S1, S2, no murmurs                         Lungs: CTA bilaterally, no wheezes, rales, or rhonchi      Assessment: Strep pharyngitis   Plan: Discussed diagnosis and treatment of strep throat. Augmentin sent to pharmacy  since she completed a course of Amoxicillin recently.  Suggested symptomatic OTC remedies such as salt water gargles, chloraseptic and lozenges. Increase fluids. Nasal saline spray for congestion.  Tylenol or Ibuprofen OTC for fever and malaise.  She will call if she gets worse or is not back to baseline after completing the antibiotic.   Discussed keeping an eye on her BP at home and let Dr. Susann GivensLalonde know if it is not within goal range after she is feeling better.

## 2017-01-22 NOTE — Patient Instructions (Signed)
Strep Throat Strep throat is a bacterial infection of the throat. Your health care provider may call the infection tonsillitis or pharyngitis, depending on whether there is swelling in the tonsils or at the back of the throat. Strep throat is most common during the cold months of the year in children who are 5-40 years of age, but it can happen during any season in people of any age. This infection is spread from person to person (contagious) through coughing, sneezing, or close contact. What are the causes? Strep throat is caused by the bacteria called Streptococcus pyogenes. What increases the risk? This condition is more likely to develop in:  People who spend time in crowded places where the infection can spread easily.  People who have close contact with someone who has strep throat.  What are the signs or symptoms? Symptoms of this condition include:  Fever or chills.  Redness, swelling, or pain in the tonsils or throat.  Pain or difficulty when swallowing.  White or yellow spots on the tonsils or throat.  Swollen, tender glands in the neck or under the jaw.  Red rash all over the body (rare).  How is this diagnosed? This condition is diagnosed by performing a rapid strep test or by taking a swab of your throat (throat culture test). Results from a rapid strep test are usually ready in a few minutes, but throat culture test results are available after one or two days. How is this treated? This condition is treated with antibiotic medicine. Follow these instructions at home: Medicines  Take over-the-counter and prescription medicines only as told by your health care provider.  Take your antibiotic as told by your health care provider. Do not stop taking the antibiotic even if you start to feel better.  Have family members who also have a sore throat or fever tested for strep throat. They may need antibiotics if they have the strep infection. Eating and drinking  Do not  share food, drinking cups, or personal items that could cause the infection to spread to other people.  If swallowing is difficult, try eating soft foods until your sore throat feels better.  Drink enough fluid to keep your urine clear or pale yellow. General instructions  Gargle with a salt-water mixture 3-4 times per day or as needed. To make a salt-water mixture, completely dissolve -1 tsp of salt in 1 cup of warm water.  Make sure that all household members wash their hands well.  Get plenty of rest.  Stay home from school or work until you have been taking antibiotics for 24 hours.  Keep all follow-up visits as told by your health care provider. This is important. Contact a health care provider if:  The glands in your neck continue to get bigger.  You develop a rash, cough, or earache.  You cough up a thick liquid that is green, yellow-brown, or bloody.  You have pain or discomfort that does not get better with medicine.  Your problems seem to be getting worse rather than better.  You have a fever. Get help right away if:  You have new symptoms, such as vomiting, severe headache, stiff or painful neck, chest pain, or shortness of breath.  You have severe throat pain, drooling, or changes in your voice.  You have swelling of the neck, or the skin on the neck becomes red and tender.  You have signs of dehydration, such as fatigue, dry mouth, and decreased urination.  You become increasingly sleepy, or   you cannot wake up completely.  Your joints become red or painful. This information is not intended to replace advice given to you by your health care provider. Make sure you discuss any questions you have with your health care provider. Document Released: 10/26/2000 Document Revised: 06/27/2016 Document Reviewed: 02/21/2015 Elsevier Interactive Patient Education  2017 [employer]  

## 2017-02-13 ENCOUNTER — Encounter: Payer: Self-pay | Admitting: Obstetrics and Gynecology

## 2017-02-13 ENCOUNTER — Ambulatory Visit (INDEPENDENT_AMBULATORY_CARE_PROVIDER_SITE_OTHER): Payer: BC Managed Care – PPO | Admitting: Obstetrics and Gynecology

## 2017-02-13 VITALS — BP 124/80 | HR 84 | Resp 16 | Ht 67.5 in | Wt 264.0 lb

## 2017-02-13 DIAGNOSIS — Z01419 Encounter for gynecological examination (general) (routine) without abnormal findings: Secondary | ICD-10-CM

## 2017-02-13 NOTE — Patient Instructions (Signed)

## 2017-02-13 NOTE — Progress Notes (Signed)
41 y.o. [id number] MarriedAfrican AmericanF here for annual exam.   Period Cycle (Days): 28 Period Duration (Days): 3-4 days  Period Pattern: Regular Menstrual Flow: Light, Heavy Menstrual Control: Maxi pad Menstrual Control Change Freq (Hours): changes pad every 2 hours on heavy day Dysmenorrhea: (!) Moderate Dysmenorrhea Symptoms: Cramping  No worsening in her cycle, slightly better. Cramps are tolerable.  Sexually active, no pain. She had an SAB last year. Trying to get her BP under control. Not trying to get pregnant now. Going to consider fostering kids with the intention to adopt.  Using condoms some times.   Patient's last menstrual period was 01/21/2017.          Sexually active: Yes.    The current method of family planning is condoms some times .    Exercising: Yes.    GYM Smoker:  no  Health Maintenance: Pap:  02-09-16 WNL NEG HR HPV History of abnormal Pap:  no MMG:  03-05-16 Breast U/S - WNL F/U screening MMG age 70 Colonoscopy:  Never BMD:   Never TDaP:  2012 Gardasil: N/A   reports that she has never smoked. She has never used smokeless tobacco. She reports that she does not drink alcohol or use drugs. 3 kids, 17, 15, 13 (her youngest lives with her parents). 2 step kids. Married x 5 years. She is a Runner, broadcasting/film/video, 4 th grade. Oldest is graduating HS, she is going to [employer].   Past Medical History:  Diagnosis Date  . Hypertension    under control with meds., has been on med. x 2 yr.  . STD (sexually transmitted disease)    trichamonus  . Umbilical hernia 10/2012    Past Surgical History:  Procedure Laterality Date  . INSERTION OF MESH  10/15/2012   Procedure: INSERTION OF MESH;  Surgeon: Robyne Askew, MD;  Location: [facility];  Service: General;  Laterality: N/A;  . UMBILICAL HERNIA REPAIR  10/15/2012   Procedure: HERNIA REPAIR UMBILICAL ADULT;  Surgeon: Robyne Askew, MD;  Location: [facility];  Service: General;  Laterality: N/A;   umbilical hernia repair with mesh  . WISDOM TOOTH EXTRACTION     as a teenager    Current Outpatient Prescriptions  Medication Sig Dispense Refill  . amLODipine (NORVASC) 10 MG tablet Take 1 tablet (10 mg total) by mouth daily. 30 tablet 12  . cetirizine (ZYRTEC) 10 MG tablet Take 10 mg by mouth daily as needed for allergies.     . cyclobenzaprine (FLEXERIL) 5 MG tablet 1-2 tablets 3 x a day as needed for muscle pain 20 tablet 0  . fluticasone (FLONASE) 50 MCG/ACT nasal spray Place 2 sprays into both nostrils daily. 48 g 3  . lisinopril-hydrochlorothiazide (PRINZIDE,ZESTORETIC) 20-25 MG tablet TAKE ONE TABLET BY MOUTH  DAILY 30 tablet 12  . naproxen sodium (ANAPROX DS) 550 MG tablet 1 tab po q 12 hours prn pain 30 tablet 1  . omeprazole (PRILOSEC OTC) 20 MG tablet Take 20 mg by mouth 2 (two) times daily.     No current facility-administered medications for this visit.     Family History  Problem Relation Age of Onset  . Breast cancer Maternal Aunt   . Diabetes Brother   . Heart disease Father     CHF  . Hypertension Father   . Hypertension Mother   . Thyroid disease Mother   . Colon cancer Neg Hx   . Colon polyps Neg Hx   .  Kidney disease Neg Hx   . Esophageal cancer Neg Hx     Review of Systems  Constitutional: Negative.   HENT: Negative.   Eyes: Negative.   Respiratory: Negative.   Cardiovascular: Negative.   Gastrointestinal: Negative.   Endocrine: Negative.   Genitourinary: Negative.   Musculoskeletal: Negative.   Skin: Negative.   Allergic/Immunologic: Negative.   Neurological: Positive for headaches.  Psychiatric/Behavioral: Negative.     Exam:   BP 124/80 (BP Location: Right Arm, Patient Position: Sitting, Cuff Size: Normal)   Pulse 84   Resp 16   Ht 5' 7.5" (1.715 m)   Wt 264 lb (119.7 kg)   LMP 01/21/2017   BMI 40.74 kg/m   Weight change: @ Height:   Height: 5' 7.5" (171.5 cm)  Ht Readings from Last 3 Encounters:  02/13/17 5' 7.5"  (1.715 m)  06/15/16  (1.702 m)  04/19/16 5' 7.25" (1.708 m)    General appearance: alert, cooperative and appears stated age Head: Normocephalic, without obvious abnormality, atraumatic Neck: no adenopathy, supple, symmetrical, trachea midline and thyroid normal to inspection and palpation Lungs: clear to auscultation bilaterally Cardiovascular: regular rate and rhythm Breasts: normal appearance, no masses or tenderness Abdomen: soft, non-tender; bowel sounds normal; no masses,  no organomegaly Extremities: extremities normal, atraumatic, no cyanosis or edema Skin: Skin color, texture, turgor normal. No rashes or lesions Lymph nodes: Cervical, supraclavicular, and axillary nodes normal. No abnormal inguinal nodes palpated Neurologic: Grossly normal   Pelvic: External genitalia:  no lesions              Urethra:  normal appearing urethra with no masses, tenderness or lesions              Bartholins and Skenes: normal                 Vagina: normal appearing vagina with normal color and discharge, no lesions              Cervix: no lesions               Bimanual Exam:  Uterus:  normal size, contour, position, consistency, mobility, non-tender              Adnexa: no mass, fullness, tenderness               Rectovaginal: Confirms               Anus:  normal sphincter tone, no lesions  Chaperone was present for exam.  A:  Well Woman with normal exam  HTN  Contraception, currently only using condoms sometimes, has considered another pregnancy, not trying  P:   No pap this year  Mammogram after she turns 41  Discussed breast self exam  Discussed calcium and vit D intake  Labs with her primary MD  Discussed the option mirena IUD for contraception and improvement in her cycle if she decides she doesn't want to get pregnant. Also discussed vasectomy  Recommended she be on a PNV  She should talk to her primary about the possibility of pregnancy (didn't do well on other anti-HTN  meds)

## 2017-09-16 ENCOUNTER — Encounter: Payer: Self-pay | Admitting: Family Medicine

## 2017-09-16 ENCOUNTER — Ambulatory Visit: Payer: BC Managed Care – PPO | Admitting: Family Medicine

## 2017-09-16 VITALS — BP 170/90 | HR 104 | Temp 98.6°F | Resp 16 | Wt 279.0 lb

## 2017-09-16 DIAGNOSIS — Z23 Encounter for immunization: Secondary | ICD-10-CM

## 2017-09-16 DIAGNOSIS — R1030 Lower abdominal pain, unspecified: Secondary | ICD-10-CM

## 2017-09-16 DIAGNOSIS — M5441 Lumbago with sciatica, right side: Secondary | ICD-10-CM

## 2017-09-16 DIAGNOSIS — R030 Elevated blood-pressure reading, without diagnosis of hypertension: Secondary | ICD-10-CM | POA: Diagnosis not present

## 2017-09-16 LAB — POCT URINALYSIS DIP (PROADVANTAGE DEVICE)
Bilirubin, UA: NEGATIVE
Blood, UA: NEGATIVE
Glucose, UA: NEGATIVE mg/dL
Ketones, POC UA: NEGATIVE mg/dL
LEUKOCYTES UA: NEGATIVE
Nitrite, UA: NEGATIVE
PH UA: 6 (ref 5.0–8.0)
PROTEIN UA: NEGATIVE mg/dL
Specific Gravity, Urine: 1.025
UUROB: NEGATIVE

## 2017-09-16 MED ORDER — TRAMADOL HCL 50 MG PO TABS: 50.0000 mg | ORAL_TABLET | Freq: Three times a day (TID) | ORAL | 0 refills | Status: DC | PRN

## 2017-09-16 NOTE — Progress Notes (Signed)
Called in Tramadol. Trixie Rude/rlb

## 2017-09-16 NOTE — Patient Instructions (Signed)
Heat to your back for 20 minutes 3 times per day. 2 Aleve twice per day. You can also take Tylenol in between. Proper posturing

## 2017-09-16 NOTE — Progress Notes (Signed)
   Subjective:    Patient ID: Debbie Gibson, female    DOB: 12-07-1975, 41 y.o.   MRN: [id number]  HPI She complains of a 10 day history of low back pain mainly on the right. Pain is made worse with motion. She does note some slight right leg weakness. No numbness or tingling. No previous history of back pain. She has been using hot and cold compresses as well as low-dose ibuprofen without much benefit. Apparently last night the pain was quite severe.   Review of Systems     Objective:   Physical Exam Alert and in no distress. Slight splinting of her low back is noted. No palpable tenderness. Straight leg raising is questionably positive at 30 with questionable positive sciatic stretch. DTRs normal. Strength normal. Hip motion normal.       Assessment & Plan:  Lower abdominal pain - Plan: POCT Urinalysis DIP (Proadvantage Device), traMADol (ULTRAM) 50 MG tablet  Need for influenza vaccination - Plan: Flu Vaccine QUAD 6+ mos PF IM (Fluarix Quad PF)  Acute right-sided low back pain with right-sided sciatica - Plan: traMADol (ULTRAM) 50 MG tablet  Elevated blood pressure reading  Recommend supportive care with heat and anti-inflammatory medication. Will also give tramadol. If symptoms worsen, she is to return here sooner otherwise I will see her in 10 days for recheck on her back and blood pressure.

## 2017-09-18 NOTE — Telephone Encounter (Signed)
Error   This encounter was created in error - please disregard. 

## 2017-09-23 ENCOUNTER — Ambulatory Visit: Payer: BC Managed Care – PPO | Admitting: Family Medicine

## 2017-09-23 ENCOUNTER — Encounter: Payer: Self-pay | Admitting: Family Medicine

## 2017-09-23 VITALS — BP 120/80 | HR 71 | Wt 275.0 lb

## 2017-09-23 DIAGNOSIS — M545 Low back pain, unspecified: Secondary | ICD-10-CM

## 2017-09-23 MED ORDER — DICLOFENAC SODIUM 75 MG PO TBEC: 75.0000 mg | DELAYED_RELEASE_TABLET | Freq: Two times a day (BID) | ORAL | 0 refills | Status: DC

## 2017-09-23 NOTE — Progress Notes (Signed)
   Subjective:    Patient ID: Knute NeuMarcia Moyd-Williams, female    DOB: May 26, 1976, 41 y.o.   MRN: [id number]  HPI She is here for a recheck.  She states that she is roughly 40% better and having less referred pain down her right leg.  She states that the tramadol actually made her stay awake.  She is still having difficulty with ADLs having to have her husband help put her shoes on.   Review of Systems     Objective:   Physical Exam Alert and in no distress.  Slight tenderness palpation over the entire sacral area.  Straight leg raising was negative.  Pearlean BrownieFaber testing was slightly positive on the right.  DTRs were diminished.       Assessment & Plan:  Acute bilateral low back pain without sciatica - Plan: DG Lumbar Spine Complete, diclofenac (VOLTAREN) 75 MG EC tablet  I will switch her to a different pain medicine.  Get x-rays to make sure.  Recheck here in 10 days.  May possibly refer to physical therapy as I do not think this is radicular in nature.

## 2017-10-01 ENCOUNTER — Ambulatory Visit
Admission: RE | Admit: 2017-10-01 | Discharge: 2017-10-01 | Disposition: A | Discharge status: Home / Self Care | Payer: BC Managed Care – PPO | Source: Ambulatory Visit | Attending: Family Medicine | Admitting: Family Medicine

## 2017-10-01 DIAGNOSIS — M545 Low back pain, unspecified: Secondary | ICD-10-CM

## 2017-10-02 ENCOUNTER — Other Ambulatory Visit: Payer: Self-pay

## 2017-10-02 DIAGNOSIS — M545 Low back pain, unspecified: Secondary | ICD-10-CM

## 2017-10-07 ENCOUNTER — Ambulatory Visit: Payer: BC Managed Care – PPO | Admitting: Family Medicine

## 2017-10-14 ENCOUNTER — Other Ambulatory Visit: Payer: Self-pay

## 2017-10-14 ENCOUNTER — Ambulatory Visit: Payer: BC Managed Care – PPO | Admitting: Obstetrics & Gynecology

## 2017-10-14 ENCOUNTER — Telehealth: Payer: Self-pay | Admitting: Obstetrics and Gynecology

## 2017-10-14 ENCOUNTER — Encounter: Payer: Self-pay | Admitting: Obstetrics & Gynecology

## 2017-10-14 VITALS — BP 160/100 | HR 76 | Resp 12 | Wt 274.0 lb

## 2017-10-14 DIAGNOSIS — N766 Ulceration of vulva: Secondary | ICD-10-CM | POA: Diagnosis not present

## 2017-10-14 MED ORDER — VALACYCLOVIR HCL 1 G PO TABS: 1000.0000 mg | ORAL_TABLET | Freq: Two times a day (BID) | ORAL | 0 refills | Status: DC

## 2017-10-14 MED ORDER — LIDOCAINE 5 % EX OINT: 1.0000 "application " | TOPICAL_OINTMENT | Freq: Four times a day (QID) | CUTANEOUS | 0 refills | Status: AC | PRN

## 2017-10-14 NOTE — Telephone Encounter (Signed)
Left message to call Tanyiah Laurich at [phone].  

## 2017-10-14 NOTE — Telephone Encounter (Signed)
Left message to call Dymin Dingledine at [phone].  

## 2017-10-14 NOTE — Telephone Encounter (Signed)
Spoke with patient, requesting OV for pelvic pain and rash on right side vulva/labia started on 12/1.   --Pelvic pain sharp, 7/10. Taking Tylenol po PRN during the day with no relief, Aleve pm at hs.   --Describes rash as multiple bumps, burns when urine touches.   --LMP 10/07/17  --Strong smell to urine.   Denies vaginal d/c, odor, N/V, fever/chills. No STD concerns, no recent changes in soaps/detergents/perfumes.   Was seen by PCP for evaluation of lower back pain 2 wks ago. UA negative, Xray negative. Rx for ultram, PT recommended. Reports this is "new pain".  Recommended OV for further evaluation, advised will need to review schedule with nursing supervisor and return call, patient is agreeable.

## 2017-10-14 NOTE — Progress Notes (Signed)
GYNECOLOGY  VISIT  CC:   Pelvic pain and vulvar rash  HPI: 41 y.o. [id number] Married [place] female here for complaint of pelvic pain that started late Friday/early Saturday.  Pain is intense at times.  Has taken tylenol and Aleve PM at night.  Reports she is "not a medicine person".  Has been able to sleep through the night.  LMP 11/19 - 11/22.  Flow is typically heavy the first day and then tapers off over the next two to three days.  This is exactly how the last cycle was as well.  Today is day #14 of cycle.  She is SA but did not attempt this over the month.  She is having some back pain as well.  So this has created some issues with having intercourse.  Denies vaginal itching or discharge.  Feels like the vagina is "sore".  Skin on the outside feels rough but is not itchy as well.    Denies STD concerns.  GYNECOLOGIC HISTORY: Patient's last menstrual period was 09/30/2017. Contraception: condoms  Patient Active Problem List   Diagnosis Date Noted  . Obesity (BMI 30-39.9) 10/11/2014  . Essential hypertension, benign 12/07/2011  . Acute low back pain without sciatica 12/07/2011  . Allergic rhinitis due to pollen 08/10/2011    Past Medical History:  Diagnosis Date  . Hypertension    under control with meds., has been on med. x 2 yr.  . STD (sexually transmitted disease)    trichamonus  . Umbilical hernia 10/2012    Past Surgical History:  Procedure Laterality Date  . INSERTION OF MESH  10/15/2012   Procedure: INSERTION OF MESH;  Surgeon: Robyne AskewPaul S Toth III, MD;  Location: [facility];  Service: General;  Laterality: N/A;  . UMBILICAL HERNIA REPAIR  10/15/2012   Procedure: HERNIA REPAIR UMBILICAL ADULT;  Surgeon: Robyne AskewPaul S Toth III, MD;  Location: [facility];  Service: General;  Laterality: N/A;  umbilical hernia repair with mesh  . WISDOM TOOTH EXTRACTION     as a teenager    MEDS:   Current Outpatient Medications on File Prior to Visit   Medication Sig Dispense Refill  . amLODipine (NORVASC) 10 MG tablet Take 1 tablet (10 mg total) by mouth daily. 30 tablet 12  . cetirizine (ZYRTEC) 10 MG tablet Take 10 mg by mouth daily as needed for allergies.     . cyclobenzaprine (FLEXERIL) 5 MG tablet 1-2 tablets 3 x a day as needed for muscle pain 20 tablet 0  . diclofenac (VOLTAREN) 75 MG EC tablet Take 1 tablet (75 mg total) 2 (two) times daily by mouth. 30 tablet 0  . lisinopril-hydrochlorothiazide (PRINZIDE,ZESTORETIC) 20-25 MG tablet TAKE ONE TABLET BY MOUTH  DAILY 30 tablet 12  . naproxen sodium (ANAPROX DS) 550 MG tablet 1 tab po q 12 hours prn pain 30 tablet 1  . omeprazole (PRILOSEC OTC) 20 MG tablet Take 20 mg by mouth 2 (two) times daily.    . fluticasone (FLONASE) 50 MCG/ACT nasal spray Place 2 sprays into both nostrils daily. 48 g 3   No current facility-administered medications on file prior to visit.     ALLERGIES: Mushroom extract complex; Peanut-containing drug products; Shellfish allergy; and Strawberry extract  Family History  Problem Relation Age of Onset  . Breast cancer Maternal Aunt   . Diabetes Brother   . Heart disease Father        CHF  . Hypertension Father   . Hypertension Mother   .  Thyroid disease Mother   . Colon cancer Neg Hx   . Colon polyps Neg Hx   . Kidney disease Neg Hx   . Esophageal cancer Neg Hx     SH:  Married, non smoker  Review of Systems  Constitutional: Positive for malaise/fatigue. Negative for chills and fever.  Respiratory: Negative.   Cardiovascular: Negative.   Genitourinary: Negative for dysuria, frequency, hematuria and urgency.       Low pelvic pain    PHYSICAL EXAMINATION:    BP (!) 160/100 (BP Location: Right Arm, Patient Position: Sitting, Cuff Size: Large)   Pulse 76   Resp 12   Wt 274 lb (124.3 kg)   LMP 09/30/2017   BMI 42.28 kg/m     General appearance: alert, cooperative and appears stated age Abdomen: soft, non-tender; bowel sounds normal; no  masses,  no organomegaly Inguinal:  No LAD  Pelvic: External genitalia:  5 ulcerated areas on inner left labia majora c/w HSV lesions              Urethra:  normal appearing urethra with no masses, tenderness or lesions              Bartholins and Skenes: normal                 Vagina: normal appearing vagina with normal color and discharge, no lesions              Cervix: no lesions              Bimanual Exam:  Uterus:  normal size, contour, position, consistency, mobility, non-tender              Adnexa: no mass, fullness, tenderness              Anus: no lesions  Chaperone was present for exam.  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  1.25gm tube to pharmacy HSV PCR of lesions obtained.  Pt declines HSV IGG testing.   Declines need for STD testing.  Will consider, however. Return 7-10 days for recheck.  Pt will see Dr. Oscar LaJertson for this visit.

## 2017-10-14 NOTE — Telephone Encounter (Signed)
Patient is having some "stomach pain and has developed a rash".

## 2017-10-14 NOTE — Telephone Encounter (Signed)
Spoke with patient, OV scheduled for today at 4pm with Dr. Hyacinth MeekerMiller. Patient aware will be worked into schedule and is agreeable.   Routing to provider for final review. Patient is agreeable to disposition. Will close encounter.  Cc: Dr. Oscar LaJertson

## 2017-10-15 LAB — HERPES SIMPLEX VIRUS(HSV) DNA BY PCR
HSV 2 DNA: POSITIVE — AB
HSV-1 DNA: NEGATIVE

## 2017-10-16 ENCOUNTER — Telehealth: Payer: Self-pay | Admitting: *Deleted

## 2017-10-16 NOTE — Telephone Encounter (Signed)
LM for pt to call back.

## 2017-10-16 NOTE — Telephone Encounter (Signed)
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  I hope she is feeling a little better.  She does have her follow up appt with Dr. Oscar Lajertson next week.  I will route this to her as well.

## 2017-10-17 NOTE — Telephone Encounter (Signed)
Pt notified.  Verbalized understanding.

## 2017-10-22 ENCOUNTER — Other Ambulatory Visit: Payer: Self-pay | Admitting: Family Medicine

## 2017-10-22 DIAGNOSIS — I10 Essential (primary) hypertension: Secondary | ICD-10-CM

## 2017-10-31 ENCOUNTER — Ambulatory Visit: Payer: Self-pay | Admitting: Obstetrics and Gynecology

## 2017-10-31 ENCOUNTER — Telehealth: Payer: Self-pay | Admitting: Obstetrics and Gynecology

## 2017-10-31 ENCOUNTER — Encounter: Payer: Self-pay | Admitting: Obstetrics and Gynecology

## 2017-10-31 NOTE — Telephone Encounter (Signed)
[facility]. I left the patient a message to call and reschedule.

## 2018-01-03 ENCOUNTER — Encounter: Payer: Self-pay | Admitting: Family Medicine

## 2018-01-03 ENCOUNTER — Ambulatory Visit: Payer: BC Managed Care – PPO | Admitting: Family Medicine

## 2018-01-03 VITALS — BP 142/88 | HR 77 | Temp 98.6°F | Wt 276.8 lb

## 2018-01-03 DIAGNOSIS — J209 Acute bronchitis, unspecified: Secondary | ICD-10-CM

## 2018-01-03 MED ORDER — AMOXICILLIN 875 MG PO TABS: 875.0000 mg | ORAL_TABLET | Freq: Two times a day (BID) | ORAL | 0 refills | Status: DC

## 2018-01-03 NOTE — Progress Notes (Signed)
   Subjective:    Patient ID: Debbie Gibson, female    DOB: Jul 16, 1976, 42 y.o.   MRN: [id number]  HPI 2 weeks ago she started having difficulty with nasal congestion, rhinorrhea and PND but no sore throat or earache.  1 week ago she developed a cough that has now become productive with associated fatigue, malaise but no fever, chills or sore throat.  She does not smoke.   Review of Systems     Objective:   Physical Exam Alert and in no distress. Tympanic membranes and canals are normal. Pharyngeal area is normal. Neck is supple without adenopathy or thyromegaly. Cardiac exam shows a regular sinus rhythm without murmurs or gallops. Lungs are clear to auscultation.        Assessment & Plan:  Acute bronchitis, unspecified organism - Plan: amoxicillin (AMOXIL) 875 MG tablet She will call if not entirely better when she finishes.  Recommend NyQuil at night and Robitussin-DM during the day.

## 2018-01-03 NOTE — Patient Instructions (Signed)
Take all the antibiotic and if not totally back to normal when you finish call me.  NyQuil at night and Robitussin-DM during the day for her coughing.  Advil or Aleve for the aches and pains

## 2018-02-17 ENCOUNTER — Ambulatory Visit: Payer: BC Managed Care – PPO | Admitting: Obstetrics and Gynecology

## 2018-03-04 ENCOUNTER — Ambulatory Visit: Payer: BC Managed Care – PPO | Admitting: Family Medicine

## 2018-03-04 ENCOUNTER — Encounter: Payer: Self-pay | Admitting: Family Medicine

## 2018-03-04 VITALS — BP 160/110 | HR 88 | Ht 68.0 in | Wt 273.8 lb

## 2018-03-04 DIAGNOSIS — I10 Essential (primary) hypertension: Secondary | ICD-10-CM | POA: Diagnosis not present

## 2018-03-04 DIAGNOSIS — G4719 Other hypersomnia: Secondary | ICD-10-CM | POA: Diagnosis not present

## 2018-03-04 DIAGNOSIS — Z833 Family history of diabetes mellitus: Secondary | ICD-10-CM | POA: Diagnosis not present

## 2018-03-04 DIAGNOSIS — R350 Frequency of micturition: Secondary | ICD-10-CM

## 2018-03-04 DIAGNOSIS — R0683 Snoring: Secondary | ICD-10-CM | POA: Diagnosis not present

## 2018-03-04 DIAGNOSIS — R5383 Other fatigue: Secondary | ICD-10-CM | POA: Diagnosis not present

## 2018-03-04 LAB — POCT URINALYSIS DIP (PROADVANTAGE DEVICE)
BILIRUBIN UA: NEGATIVE
GLUCOSE UA: NEGATIVE mg/dL
Ketones, POC UA: NEGATIVE mg/dL
Leukocytes, UA: NEGATIVE
Nitrite, UA: NEGATIVE
PH UA: 6 (ref 5.0–8.0)
Protein Ur, POC: NEGATIVE mg/dL
RBC UA: NEGATIVE
SPECIFIC GRAVITY, URINE: 1.015
Urobilinogen, Ur: NEGATIVE

## 2018-03-04 NOTE — Progress Notes (Signed)
   Subjective:    Patient ID: Knute NeuMarcia Moyd-Williams, female    DOB: January 31, 1976, 42 y.o.   MRN: [id number]  HPI Chief Complaint  Patient presents with  . bp running high    bp running high. highest 187/103   She is a 42 year old female with a history of previously controlled HTN who is here with complaints of elevated blood pressures for the past for the past week. States she has been extremely tired for the past few weeks. States she falls asleep whenever she sits down. Never feels rested and this is new. Denies history of sleep apnea.  Also has noticed urinary frequency, drinking more water than usual. No personal history of diabetes.  States she snores.   Denies taking any new medications or supplements.   Denies fever, chills, dizziness, chest pain, palpitations, shortness of breath, orthopnea, abdominal pain, N/V/D, LE edema.     Review of Systems Pertinent positives and negatives in the history of present illness.     Objective:   Physical Exam BP (!) 160/110   Pulse 88   Ht 5\' 8"  (1.727 m)   Wt 273 lb 12.8 oz (124.2 kg)   SpO2 99%   BMI 41.63 kg/m  Alert and in no distress.  Pharyngeal area is normal. Neck is supple without adenopathy or thyromegaly. Cardiac exam shows a regular sinus rhythm without murmurs or gallops. Lungs are clear to auscultation. Extremities without edema, pulses intact. Skin is warm and dry, no pallor.       Assessment & Plan:  Essential hypertension, benign - Plan: CBC with Differential/Platelet, Comprehensive metabolic panel, POCT Urinalysis DIP (Proadvantage Device), EKG 12-Lead  Morbid obesity (HCC) - Plan: Hemoglobin A1c, Home sleep test  Fatigue, unspecified type - Plan: CBC with Differential/Platelet, Comprehensive metabolic panel, POCT Urinalysis DIP (Proadvantage Device), TSH, VITAMIN D 25 Hydroxy (Vit-D Deficiency, Fractures), EKG 12-Lead  Excessive daytime sleepiness - Plan: Home sleep test  Snoring - Plan: Home sleep test  Urinary  frequency - Plan: POCT Urinalysis DIP (Proadvantage Device), Hemoglobin A1c  Family history of diabetes mellitus in brother - Plan: Hemoglobin A1c  ECG unremarkable and reviewed by Dr. Susann GivensLalonde.  UA negative  HTN is uncontrolled for the past week per patient and is elevated today. New symptoms of severe fatigue, symptoms suspicious for sleep apnea. Will have her continue current medications and keep an eye on her BP at home. Advised to eat a low sodium diet.  Epworth sleepiness scale 16.  Referral for sleep study.  Will have her follow up pending labs results.

## 2018-03-05 ENCOUNTER — Other Ambulatory Visit: Payer: Self-pay | Admitting: Family Medicine

## 2018-03-05 ENCOUNTER — Encounter: Payer: Self-pay | Admitting: Family Medicine

## 2018-03-05 DIAGNOSIS — E559 Vitamin D deficiency, unspecified: Secondary | ICD-10-CM

## 2018-03-05 DIAGNOSIS — R7303 Prediabetes: Secondary | ICD-10-CM | POA: Insufficient documentation

## 2018-03-05 DIAGNOSIS — E876 Hypokalemia: Secondary | ICD-10-CM | POA: Insufficient documentation

## 2018-03-05 HISTORY — DX: Prediabetes: R73.03

## 2018-03-05 HISTORY — DX: Vitamin D deficiency, unspecified: E55.9

## 2018-03-05 HISTORY — DX: Hypokalemia: E87.6

## 2018-03-05 LAB — CBC WITH DIFFERENTIAL/PLATELET
BASOS: 0 %
Basophils Absolute: 0 10*3/uL (ref 0.0–0.2)
EOS (ABSOLUTE): 0.1 10*3/uL (ref 0.0–0.4)
EOS: 2 %
HEMATOCRIT: 37 % (ref 34.0–46.6)
HEMOGLOBIN: 12.3 g/dL (ref 11.1–15.9)
IMMATURE GRANULOCYTES: 0 %
Immature Grans (Abs): 0 10*3/uL (ref 0.0–0.1)
LYMPHS: 31 %
Lymphocytes Absolute: 2.1 10*3/uL (ref 0.7–3.1)
MCH: 26.1 pg — ABNORMAL LOW (ref 26.6–33.0)
MCHC: 33.2 g/dL (ref 31.5–35.7)
MCV: 78 fL — AB (ref 79–97)
MONOCYTES: 5 %
Monocytes Absolute: 0.3 10*3/uL (ref 0.1–0.9)
NEUTROS PCT: 62 %
Neutrophils Absolute: 4.2 10*3/uL (ref 1.4–7.0)
Platelets: 256 10*3/uL (ref 150–379)
RBC: 4.72 x10E6/uL (ref 3.77–5.28)
RDW: 15.7 % — ABNORMAL HIGH (ref 12.3–15.4)
WBC: 6.7 10*3/uL (ref 3.4–10.8)

## 2018-03-05 LAB — COMPREHENSIVE METABOLIC PANEL
A/G RATIO: 1.2 (ref 1.2–2.2)
ALT: 11 IU/L (ref 0–32)
AST: 13 IU/L (ref 0–40)
Albumin: 4.2 g/dL (ref 3.5–5.5)
Alkaline Phosphatase: 90 IU/L (ref 39–117)
BUN/Creatinine Ratio: 6 — ABNORMAL LOW (ref 9–23)
BUN: 6 mg/dL (ref 6–24)
Bilirubin Total: <0.2 mg/dL (ref 0.0–1.2)
CALCIUM: 9.3 mg/dL (ref 8.7–10.2)
CO2: 25 mmol/L (ref 20–29)
CREATININE: 0.95 mg/dL (ref 0.57–1.00)
Chloride: 97 mmol/L (ref 96–106)
GFR calc Af Amer: 87 mL/min/{1.73_m2} (ref >59)
GFR, EST NON AFRICAN AMERICAN: 75 mL/min/{1.73_m2} (ref >59)
GLOBULIN, TOTAL: 3.4 g/dL (ref 1.5–4.5)
Glucose: 137 mg/dL — ABNORMAL HIGH (ref 65–99)
POTASSIUM: 3.4 mmol/L — AB (ref 3.5–5.2)
SODIUM: 139 mmol/L (ref 134–144)
Total Protein: 7.6 g/dL (ref 6.0–8.5)

## 2018-03-05 LAB — HEMOGLOBIN A1C
Est. average glucose Bld gHb Est-mCnc: 120 mg/dL
HEMOGLOBIN A1C: 5.8 % — AB (ref 4.8–5.6)

## 2018-03-05 LAB — VITAMIN D 25 HYDROXY (VIT D DEFICIENCY, FRACTURES): Vit D, 25-Hydroxy: 11 ng/mL — ABNORMAL LOW (ref 30.0–100.0)

## 2018-03-05 LAB — TSH: TSH: 1.66 u[IU]/mL (ref 0.450–4.500)

## 2018-03-05 MED ORDER — VITAMIN D (ERGOCALCIFEROL) 1.25 MG (50000 UNIT) PO CAPS: 50000.0000 [IU] | ORAL_CAPSULE | ORAL | 0 refills | Status: DC

## 2018-03-12 ENCOUNTER — Encounter: Payer: Self-pay | Admitting: Obstetrics and Gynecology

## 2018-03-12 ENCOUNTER — Other Ambulatory Visit: Payer: Self-pay

## 2018-03-12 ENCOUNTER — Ambulatory Visit (INDEPENDENT_AMBULATORY_CARE_PROVIDER_SITE_OTHER): Payer: BC Managed Care – PPO | Admitting: Obstetrics and Gynecology

## 2018-03-12 VITALS — BP 148/92 | HR 84 | Resp 16 | Ht 67.0 in | Wt 271.0 lb

## 2018-03-12 DIAGNOSIS — I10 Essential (primary) hypertension: Secondary | ICD-10-CM

## 2018-03-12 DIAGNOSIS — Z8619 Personal history of other infectious and parasitic diseases: Secondary | ICD-10-CM

## 2018-03-12 DIAGNOSIS — Z01419 Encounter for gynecological examination (general) (routine) without abnormal findings: Secondary | ICD-10-CM | POA: Diagnosis not present

## 2018-03-12 MED ORDER — VALACYCLOVIR HCL 500 MG PO TABS: ORAL_TABLET | ORAL | 1 refills | Status: AC

## 2018-03-12 NOTE — Patient Instructions (Signed)

## 2018-03-12 NOTE — Progress Notes (Signed)
42 y.o. [id number] MarriedAfrican AmericanF here for annual exam.   Period Cycle (Days): 28 Period Duration (Days): 3-4 days  Period Pattern: Regular Menstrual Flow: Moderate, Heavy Menstrual Control: Maxi pad Menstrual Control Change Freq (Hours): changes pad every 2-3 hours  Dysmenorrhea: None  No pain with intercourse, mostly using condoms.  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   She is hypertensive, checks her BP at home. She saw her primary last week because her BPs had been in the 160-170/105. Primary did blood work and an EKG, didn't change her medication.   Patient's last menstrual period was 02/13/2018.          Sexually active: Yes.    The current method of family planning is condoms most of the time.    Exercising: Yes.    walking Smoker:  no  Health Maintenance: Pap:  02-09-16 WNL NEG HR HPV History of abnormal Pap:  no MMG:  03-05-16 breast U/S WNL  Colonoscopy:  Never BMD:   Never TDaP:  2012 Gardasil: no, discussed doing it.    reports that she has never smoked. She has never used smokeless tobacco. 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 3 kids, 2 step children.  Past Medical History:  Diagnosis Date  . Genital herpes   . History of trichomoniasis   . Hypertension    under control with meds., has been on med. x 2 yr.  . Hypokalemia 03/05/2018  . Prediabetes 03/05/2018  . Umbilical hernia 10/2012  . Vitamin D deficiency 03/05/2018   Started on high dose vitamin D on 03/05/2018    Past Surgical History:  Procedure Laterality Date  . INSERTION OF MESH  10/15/2012   Procedure: INSERTION OF MESH;  Surgeon: Robyne Askew, MD;  Location: [facility];  Service: General;  Laterality: N/A;  . UMBILICAL HERNIA REPAIR  10/15/2012   Procedure: HERNIA REPAIR UMBILICAL ADULT;  Surgeon: Robyne Askew, MD;  Location: [facility];  Service: General;  Laterality: N/A;   umbilical hernia repair with mesh  . WISDOM TOOTH EXTRACTION     as a teenager    Current Outpatient Medications  Medication Sig Dispense Refill  . amLODipine (NORVASC) 10 MG tablet TAKE ONE TABLET BY MOUTH DAILY 30 tablet 12  . cetirizine (ZYRTEC) 10 MG tablet Take 10 mg by mouth daily as needed for allergies.     . cyclobenzaprine (FLEXERIL) 5 MG tablet 1-2 tablets 3 x a day as needed for muscle pain 20 tablet 0  . fluticasone (FLONASE) 50 MCG/ACT nasal spray Place 2 sprays into both nostrils daily. 48 g 3  . lisinopril-hydrochlorothiazide (PRINZIDE,ZESTORETIC) 20-25 MG tablet TAKE ONE TABLET BY MOUTH DAILY 30 tablet 12  . montelukast (SINGULAIR) 10 MG tablet   11  . naproxen sodium (ANAPROX DS) 550 MG tablet 1 tab po q 12 hours prn pain 30 tablet 1  . omeprazole (PRILOSEC OTC) 20 MG tablet Take 20 mg by mouth 2 (two) times daily.    . valACYclovir (VALTREX) 1000 MG tablet Take 1 tablet (1,000 mg total) by mouth 2 (two) times daily. Take for 10 days 20 tablet 0  . Vitamin D, Ergocalciferol, (DRISDOL) 50000 units CAPS capsule Take 1 capsule (50,000 Units total) by mouth every 7 (seven) days. 8 capsule 0  . lidocaine (XYLOCAINE) 5 % ointment Apply 1 application topically 4 (four) times daily as needed. (Patient not  taking: Reported on 03/12/2018) 1.25 g 0   No current facility-administered medications for this visit.     Family History  Problem Relation Age of Onset  . Breast cancer Maternal Aunt   . Diabetes Brother   . Heart disease Father        CHF  . Hypertension Father   . Hypertension Mother   . Thyroid disease Mother   . Colon cancer Neg Hx   . Colon polyps Neg Hx   . Kidney disease Neg Hx   . Esophageal cancer Neg Hx   MAunt with breast cancer in her early 48's. Mom has 3 sisters, only one with breast cancer.   Review of Systems  Constitutional: Negative.   HENT: Negative.   Eyes: Negative.   Respiratory: Negative.   Cardiovascular: Negative.   Gastrointestinal:  Negative.   Endocrine: Negative.   Genitourinary: Negative.   Musculoskeletal: Negative.   Skin: Negative.   Allergic/Immunologic: Negative.   Neurological: Negative.   Psychiatric/Behavioral: Negative.     Exam:   BP (!) 190/110 (BP Location: Right Arm, Patient Position: Sitting, Cuff Size: Normal)   Pulse 84   Resp 16   Ht  (1.702 m)   Wt 271 lb (122.9 kg)   LMP 02/13/2018   BMI 42.44 kg/m   Weight change: @ Height:   Height:  (170.2 cm)  Ht Readings from Last 3 Encounters:  03/12/18  (1.702 m)  03/04/18  (1.727 m)  02/13/17 5' 7.5" (1.715 m)    General appearance: alert, cooperative and appears stated age Head: Normocephalic, without obvious abnormality, atraumatic Neck: no adenopathy, supple, symmetrical, trachea midline and thyroid normal to inspection and palpation Lungs: clear to auscultation bilaterally Cardiovascular: regular rate and rhythm Breasts: normal appearance, no masses or tenderness Abdomen: soft, non-tender; non distended,  no masses,  no organomegaly Extremities: extremities normal, atraumatic, no cyanosis or edema Skin: Skin color, texture, turgor normal. No rashes or lesions Lymph nodes: Cervical, supraclavicular, and axillary nodes normal. No abnormal inguinal nodes palpated Neurologic: Grossly normal   Pelvic: External genitalia:  no lesions              Urethra:  normal appearing urethra with no masses, tenderness or lesions              Bartholins and Skenes: normal                 Vagina: normal appearing vagina with normal color and discharge, no lesions              Cervix: no lesions               Bimanual Exam:  Uterus:  normal size, contour, position, consistency, mobility, non-tender              Adnexa: no mass, fullness, tenderness               Rectovaginal: Confirms               Anus:  normal sphincter tone, no lesions  Chaperone was present for exam.  A:  Well Woman with normal  exam  Hypertension, very high today, repeat BP 148/92  H/O HSV, only one outbreak  P:   No pap this year  Recheck her BP improved, will need to f/u with primary  Valtrex for prn use  Mammogram due  Labs with primary MD  Discussed breast self exam  Discussed calcium and vit D intake  Gardasil information given

## 2018-03-17 ENCOUNTER — Other Ambulatory Visit: Payer: Self-pay | Admitting: Family Medicine

## 2018-03-17 DIAGNOSIS — R0683 Snoring: Secondary | ICD-10-CM

## 2018-03-17 DIAGNOSIS — G4719 Other hypersomnia: Secondary | ICD-10-CM

## 2018-03-20 ENCOUNTER — Ambulatory Visit: Payer: BC Managed Care – PPO | Admitting: Family Medicine

## 2018-03-28 ENCOUNTER — Encounter: Payer: Self-pay | Admitting: Family Medicine

## 2018-03-28 ENCOUNTER — Ambulatory Visit: Payer: BC Managed Care – PPO | Admitting: Family Medicine

## 2018-03-28 DIAGNOSIS — I10 Essential (primary) hypertension: Secondary | ICD-10-CM

## 2018-03-28 NOTE — Progress Notes (Signed)
   Subjective:    Patient ID: Debbie Gibson, female    DOB: [birth year], 42 y.o.   MRN: [id number]  HPI She is here for a recheck.  She has started to make some dietary as well as exercise changes.  She started roughly 3 weeks ago.  She continues on her amlodipine and lisinopril/HCTZ.  She is also now taking a vitamin with iron.   Review of Systems     Objective:   Physical Exam Alert and in no distress.  Blood pressure is recorded.       Assessment & Plan:  Morbid obesity (HCC)  Essential hypertension, benign  The blood pressure does show a slight drop.  I explained that I would like her to continue with her diet and exercise regimen and I can recheck her in 2 months.  She is also getting ready to have a sleep study done.

## 2018-04-16 ENCOUNTER — Ambulatory Visit (HOSPITAL_BASED_OUTPATIENT_CLINIC_OR_DEPARTMENT_OTHER): Payer: BC Managed Care – PPO | Attending: Family Medicine | Admitting: Internal Medicine

## 2018-04-16 DIAGNOSIS — R0683 Snoring: Secondary | ICD-10-CM | POA: Diagnosis present

## 2018-04-16 DIAGNOSIS — Z6839 Body mass index (BMI) 39.0-39.9, adult: Secondary | ICD-10-CM | POA: Insufficient documentation

## 2018-04-16 DIAGNOSIS — G4710 Hypersomnia, unspecified: Secondary | ICD-10-CM | POA: Diagnosis present

## 2018-04-16 DIAGNOSIS — G4719 Other hypersomnia: Secondary | ICD-10-CM

## 2018-04-16 DIAGNOSIS — G4733 Obstructive sleep apnea (adult) (pediatric): Secondary | ICD-10-CM | POA: Insufficient documentation

## 2018-04-27 NOTE — Procedures (Signed)
   Patient Name: Debbie Gibson, [name] Study Date: 04/16/2018 Gender: Female D.O.B: November 20, 1975 Age (years): 41 Referring Provider: Avanell ShackletonVickie L Henson NP Height (inches): 69 Interpreting Physician: Jetty Duhamellinton Young MD, ABSM Weight (lbs): 269 RPSGT: Shelah LewandowskyGregory, Kenyon BMI: 40 MRN: [id number] Neck Size: 15.50  CLINICAL INFORMATION Sleep Study Type: NPSG Indication for sleep study: Excessive Daytime Sleepiness, Fatigue, Hypertension, Morning Headaches, Obesity, Snoring  Epworth Sleepiness Score: 14  SLEEP STUDY TECHNIQUE As per the AASM Manual for the Scoring of Sleep and Associated Events v2.3 (April 2016) with a hypopnea requiring 4% desaturations.  The channels recorded and monitored were frontal, central and occipital EEG, electrooculogram (EOG), submentalis EMG (chin), nasal and oral airflow, thoracic and abdominal wall motion, anterior tibialis EMG, snore microphone, electrocardiogram, and pulse oximetry.  MEDICATIONS Medications self-administered by patient taken the night of the study : none reported  SLEEP ARCHITECTURE The study was initiated at 10:33:59 PM and ended at 5:14:50 AM.  Sleep onset time was 28.6 minutes and the sleep efficiency was 69.4%%. The total sleep time was 278 minutes.  Stage REM latency was 167.5 minutes.  The patient spent 21.0%% of the night in stage N1 sleep, 64.9%% in stage N2 sleep, 0.0%% in stage N3 and 14.03% in REM.  Alpha intrusion was absent.  Supine sleep was 41.37%.  RESPIRATORY PARAMETERS The overall apnea/hypopnea index (AHI) was 12.3 per hour. There were 11 total apneas, including 11 obstructive, 0 central and 0 mixed apneas. There were 46 hypopneas and 39 RERAs.  The AHI during Stage REM sleep was 70.8 per hour.  AHI while supine was 16.2 per hour.  The mean oxygen saturation was 95.8%. The minimum SpO2 during sleep was 80.0%.  moderate snoring was noted during this study.  CARDIAC DATA The 2 lead EKG demonstrated sinus rhythm.  The mean heart rate was 69.0 beats per minute. Other EKG findings include: PVCs.  LEG MOVEMENT DATA The total PLMS were 0 with a resulting PLMS index of 0.0. Associated arousal with leg movement index was 0.0 .  IMPRESSIONS - Mild obstructive sleep apnea occurred during this study (AHI = 12.3/h). - Insufficient early events to meet protocol requirements for split CPAP titration on this study night. - No significant central sleep apnea occurred during this study (CAI = 0.0/h). - Moderate oxygen desaturation was noted during this study (Min O2 = 80.0%). - The patient snored with moderate snoring volume. - EKG findings include PVCs. - Clinically significant periodic limb movements did not occur during sleep. No significant associated arousals.  DIAGNOSIS - Obstructive Sleep Apnea (327.23 [G47.33 ICD-10])  RECOMMENDATIONS - Suggest CPAP titration sleep study, or DME autopap. Other options would be based on clinical judgment. - Be careful with alcohol, sedatives and other CNS depressants that may worsen sleep apnea and disrupt normal sleep architecture. - Sleep hygiene should be reviewed to assess factors that may improve sleep quality. - Weight management and regular exercise should be initiated or continued if appropriate.  [Electronically signed] 04/27/2018 06:45 PM  Jetty Duhamellinton Young MD, ABSM Diplomate, American Board of Sleep Medicine   NPI: [id number]                          Jetty Duhamellinton Young Diplomate, American Board of Sleep Medicine  ELECTRONICALLY SIGNED ON:  04/27/2018, 6:41 PM [facility] PH: [phone]   FX: [phone] ACCREDITED BY THE AMERICAN ACADEMY OF SLEEP MEDICINE

## 2018-04-28 ENCOUNTER — Other Ambulatory Visit: Payer: Self-pay | Admitting: Family Medicine

## 2018-04-28 DIAGNOSIS — G4733 Obstructive sleep apnea (adult) (pediatric): Secondary | ICD-10-CM

## 2018-05-13 ENCOUNTER — Encounter: Payer: Self-pay | Admitting: Family Medicine

## 2018-05-13 ENCOUNTER — Ambulatory Visit: Payer: BC Managed Care – PPO | Admitting: Family Medicine

## 2018-05-13 DIAGNOSIS — I10 Essential (primary) hypertension: Secondary | ICD-10-CM

## 2018-05-13 DIAGNOSIS — G4733 Obstructive sleep apnea (adult) (pediatric): Secondary | ICD-10-CM

## 2018-05-13 NOTE — Patient Instructions (Signed)
Had and he send me a read out sometime after July 28 and set up an appointment to see me mid August

## 2018-05-13 NOTE — Progress Notes (Signed)
   Subjective:    Patient ID: Debbie Gibson, female    DOB: 07/11/1976, 42 y.o.   MRN: [id number]  HPI She is here for recheck on her hypertension.  She was recently diagnosed with OSA and now is on CPAP.  She has been on that for several days and does note that she feels more rested and not having a headache.  She is also not waking up several times at night and falling asleep during the day.   Review of Systems     Objective:   Physical Exam Alert and in no distress.  Blood pressure is recorded.       Assessment & Plan:  Morbid obesity (HCC)  OSA (obstructive sleep apnea)  Essential hypertension, benign  I discussed the relationship to blood pressure and her OSA as well as her weight.  Presently she is on CPAP titration.  She will ask for a read out in about 1 month and have it sent to me.  I will see her in roughly 6 weeks to determine the benefit of the CPAP and also check her blood pressure. Then discussed weight in relation to OSA and her blood pressure.  Strongly encouraged her to continue with making changes in her diet and exercise pattern but making them long-term.  Recommend setting a goal of the particular pants or dressed size.  Recheck here in 6 weeks.

## 2018-06-20 ENCOUNTER — Encounter: Payer: Self-pay | Admitting: Family Medicine

## 2018-06-20 ENCOUNTER — Ambulatory Visit: Payer: BC Managed Care – PPO | Admitting: Family Medicine

## 2018-06-20 VITALS — BP 136/88 | HR 75 | Temp 98.0°F | Wt 275.8 lb

## 2018-06-20 DIAGNOSIS — G4733 Obstructive sleep apnea (adult) (pediatric): Secondary | ICD-10-CM | POA: Diagnosis not present

## 2018-06-20 NOTE — Progress Notes (Signed)
   Subjective:    Patient ID: Knute NeuMarcia Moyd-Williams, female    DOB: 1976-02-13, 42 y.o.   MRN: [id number]  HPI She is here for a recheck.  She has underlying OSA and has been on a CPAP for approximately 1 month.  She notes a definite improvement in her sleeping and and her energy.  She has also started walking regularly and has made some dietary changes.   Review of Systems     Objective:   Physical Exam Alert and in no distress otherwise not examined.  Exam of the compliance shows 23% of the time greater than 4 hours with a sleep and 57% compliance.  He did show an improvement in her apnea index.       Assessment & Plan:  OSA (obstructive sleep apnea)  Morbid obesity (HCC) Encouraged her to continue with her diet and exercise.  I will have her send in another reading in 1 month as she has not reached the proper threshold.

## 2018-06-23 ENCOUNTER — Telehealth: Payer: Self-pay

## 2018-06-23 NOTE — Telephone Encounter (Signed)
Called pt to advise that we got her CPAP results back and Dr. Susann GivensLalonde asked that pt use the machine all 30 days and schedule follow up appt. Thanks [employer]

## 2019-01-12 ENCOUNTER — Encounter: Payer: Self-pay | Admitting: Family Medicine

## 2019-01-12 ENCOUNTER — Ambulatory Visit: Payer: BC Managed Care – PPO | Admitting: Family Medicine

## 2019-01-12 VITALS — BP 150/100 | HR 64 | Temp 98.2°F | Ht 68.5 in | Wt 257.4 lb

## 2019-01-12 DIAGNOSIS — M533 Sacrococcygeal disorders, not elsewhere classified: Secondary | ICD-10-CM | POA: Diagnosis not present

## 2019-01-12 DIAGNOSIS — M545 Low back pain, unspecified: Secondary | ICD-10-CM

## 2019-01-12 DIAGNOSIS — M62838 Other muscle spasm: Secondary | ICD-10-CM | POA: Diagnosis not present

## 2019-01-12 DIAGNOSIS — I10 Essential (primary) hypertension: Secondary | ICD-10-CM

## 2019-01-12 LAB — POCT URINALYSIS DIP (PROADVANTAGE DEVICE)
BILIRUBIN UA: NEGATIVE
BILIRUBIN UA: NEGATIVE mg/dL
Blood, UA: NEGATIVE
Glucose, UA: NEGATIVE mg/dL
Leukocytes, UA: NEGATIVE
Nitrite, UA: NEGATIVE
PROTEIN UA: NEGATIVE mg/dL
Specific Gravity, Urine: 1.02
Urobilinogen, Ur: NEGATIVE
pH, UA: 6 (ref 5.0–8.0)

## 2019-01-12 MED ORDER — CYCLOBENZAPRINE HCL 5 MG PO TABS: 5.0000 mg | ORAL_TABLET | Freq: Three times a day (TID) | ORAL | 0 refills | Status: AC | PRN

## 2019-01-12 MED ORDER — NAPROXEN SODIUM 550 MG PO TABS: 550.0000 mg | ORAL_TABLET | Freq: Two times a day (BID) | ORAL | 0 refills | Status: AC

## 2019-01-12 NOTE — Patient Instructions (Signed)
  Your pain is related to the SI joint, and muscle spasm in the buttock muscles. Continue heat, you can try massage, and do the stretches down multiple times throughout the day. We are prescribing the same medications you took in 2017. Take the naproxen twice daily with food, regularly until you are better (for up to the full 2 weeks; you can stop as soon as you're better). Use the flexeril (muscle relaxant) only at night, due to the sedating nature of it. Contact us if you need to change to a less sedating muscle relaxant that can be taken during the day. Consider physical therapy or seeing a chiropractor if pain isn't significantly better within a week. You can continue to use topical medications (SalonPas, Biofreeze, etc).

## 2019-01-12 NOTE — Progress Notes (Signed)
Chief Complaint  Patient presents with  . Back Pain    left sided low back pain for about 10 days-no falls or heavy lifting. Sometimes it is really sharp. This is a new complaint. UA was negative.    While at work one day (about a week and a half ago), she noticed pain in her central lower back. It has since spread to the left lower back, deep. Pain is sharp, stabbing intermittently, with a constant ache.  Doesn't radiate into the left leg.   Feels worse when she is laying in bed, hurts to roll over, get out of bed. Heating pad helps temporarily.  Taking Tylenol PM at night--pain still kept her up at night.  Hasn't taken any medications during the day.  When pain first started she used SalonPas patch (not since it moved to the left side), as she keeps these at home.  Helped only temporarily.  Currently pain is 8/10.  BP's are normally much better.  Denies numbness or tingling. No fever, chills or bowel or bladder changes.   She has taken flexeril in the past; only took it at night, and recalls it helping. Previously recalls tolerating naproxen 550mg  without problems. These were last prescribed in 2017  PMH, PSH, SH reviewed  Outpatient Encounter Medications as of 01/12/2019  Medication Sig  . amLODipine (NORVASC) 10 MG tablet TAKE ONE TABLET BY MOUTH DAILY  . cetirizine (ZYRTEC) 10 MG tablet Take 10 mg by mouth daily as needed for allergies.   . diphenhydramine-acetaminophen (TYLENOL PM) 25-500 MG TABS tablet Take 2 tablets by mouth at bedtime as needed.  Marland Kitchen lisinopril-hydrochlorothiazide (PRINZIDE,ZESTORETIC) 20-25 MG tablet TAKE ONE TABLET BY MOUTH DAILY  . montelukast (SINGULAIR) 10 MG tablet   . cyclobenzaprine (FLEXERIL) 5 MG tablet 1-2 tablets 3 x a day as needed for muscle pain (Patient not taking: Reported on 01/12/2019)  . fluticasone (FLONASE) 50 MCG/ACT nasal spray Place 2 sprays into both nostrils daily. (Patient not taking: Reported on 01/12/2019)  . lidocaine (XYLOCAINE) 5 %  ointment Apply 1 application topically 4 (four) times daily as needed. (Patient not taking: Reported on 01/12/2019)  . naproxen sodium (ANAPROX DS) 550 MG tablet 1 tab po q 12 hours prn pain (Patient not taking: Reported on 01/12/2019)  . valACYclovir (VALTREX) 500 MG tablet Take one tablet a day for 3 days prn (Patient not taking: Reported on 01/12/2019)  . [DISCONTINUED] omeprazole (PRILOSEC OTC) 20 MG tablet Take 20 mg by mouth 2 (two) times daily.  . [DISCONTINUED] Vitamin D, Ergocalciferol, (DRISDOL) 50000 units CAPS capsule Take 1 capsule (50,000 Units total) by mouth every 7 (seven) days. (Patient not taking: Reported on 05/13/2018)   No facility-administered encounter medications on file as of 01/12/2019.    Allergies  Allergen Reactions  . Mushroom Extract Complex   . Peanut-Containing Drug Products   . Shellfish Allergy Hives and Swelling  . Strawberry Extract     ROS:  No fever, chills, cough, shortness of breath, chest pain or headache.  No numbness, tingling, weakness, bowel or bladder complaints.  No bleeding, bruising, rash.   PHYSICAL EXAM:  BP (!) 150/100   Pulse 64   Temp 98.2 F (36.8 C) (Tympanic)   Ht 5' 8.5" (1.74 m)   Wt 257 lb 6.4 oz (116.8 kg)   LMP 01/05/2019 (Exact Date)   BMI 38.57 kg/m   Pleasant, well-appearing female in no distress.  Discomfort noted with position changes, but comfortable while seated. HEENT: conjunctiva and sclera are  clear,EOMI Neck: no lymphadenopathy or mass, no spinal tenderness Heart: regular rate and rhythm Lungs: clear bilaterally Back: Tender down central portion of entire lower lumbar spine and sacrum. Tender at left SI joint and into the buttock muscles. Some hypermobility was noted of the left SI joint with forward flexion. Decreased range of motion and pain with pyriformis stretch on the left only Neuro: alert and oriented, cranial nerves grossly intact. Normal strength, sensation in lower extremities. DTR's 2+ on the  left Negative straight leg raise. Normal gait Psych: normal mood, affect, hygiene and grooming  Urine dip normal   ASSESSMENT/PLAN:  Sacroiliac joint dysfunction of left side - consider chiro or PT if not responding to NSAIDs and muscle relaxants - Plan: naproxen sodium (ANAPROX DS) 550 MG tablet  Low back pain, unspecified back pain laterality, unspecified chronicity, unspecified whether sciatica present - Plan: POCT Urinalysis DIP (Proadvantage Device), naproxen sodium (ANAPROX DS) 550 MG tablet  Muscle spasm - heat, massage, stretches shown. risks/SE of muscle relaxants reviewed - Plan: cyclobenzaprine (FLEXERIL) 5 MG tablet  Essential hypertension, benign - elevated today, likely secondary to pain. Cont current meds and monitoring at home, f/u if stays high  Risks/SE of NSAIDs and muscle relaxants reviewed in detail, as well as red flags for which she should return.  Next steps include chiro vs PT if not responding to these measures.    Your pain is related to the SI joint, and muscle spasm in the buttock muscles. Continue heat, you can try massage, and do the stretches down multiple times throughout the day. We are prescribing the same medications you took in 2017. Take the naproxen twice daily with food, regularly until you are better (for up to the full 2 weeks; you can stop as soon as you're better). Use the flexeril (muscle relaxant) only at night, due to the sedating nature of it. Contact us if you need to change to a less sedating muscle relaxant that can be taken during the day. Consider physical therapy or seeing a chiropractor if pain isn't significantly better within a week. You can continue to use topical medications (SalonPas, Biofreeze, etc).

## 2019-03-27 ENCOUNTER — Other Ambulatory Visit: Payer: Self-pay

## 2019-03-29 ENCOUNTER — Other Ambulatory Visit: Payer: Self-pay | Admitting: Family Medicine

## 2019-03-29 DIAGNOSIS — I10 Essential (primary) hypertension: Secondary | ICD-10-CM

## 2019-03-30 ENCOUNTER — Other Ambulatory Visit: Payer: Self-pay

## 2019-03-30 ENCOUNTER — Ambulatory Visit: Payer: BC Managed Care – PPO | Admitting: Obstetrics and Gynecology

## 2019-03-30 ENCOUNTER — Encounter: Payer: Self-pay | Admitting: Obstetrics and Gynecology

## 2019-03-30 ENCOUNTER — Other Ambulatory Visit (HOSPITAL_COMMUNITY)
Admission: RE | Admit: 2019-03-30 | Discharge: 2019-03-30 | Disposition: A | Discharge status: Home / Self Care | Payer: BC Managed Care – PPO | Source: Ambulatory Visit | Attending: Obstetrics and Gynecology | Admitting: Obstetrics and Gynecology

## 2019-03-30 ENCOUNTER — Telehealth: Payer: Self-pay | Admitting: Family Medicine

## 2019-03-30 VITALS — BP 142/90 | HR 80 | Temp 97.8°F | Ht 68.0 in | Wt 262.0 lb

## 2019-03-30 DIAGNOSIS — Z124 Encounter for screening for malignant neoplasm of cervix: Secondary | ICD-10-CM | POA: Diagnosis not present

## 2019-03-30 DIAGNOSIS — Z01419 Encounter for gynecological examination (general) (routine) without abnormal findings: Secondary | ICD-10-CM

## 2019-03-30 DIAGNOSIS — I10 Essential (primary) hypertension: Secondary | ICD-10-CM

## 2019-03-30 MED ORDER — AMLODIPINE BESYLATE 10 MG PO TABS: 10.0000 mg | ORAL_TABLET | Freq: Every day | ORAL | 12 refills | Status: AC

## 2019-03-30 MED ORDER — MONTELUKAST SODIUM 10 MG PO TABS: 10.0000 mg | ORAL_TABLET | Freq: Every day | ORAL | 1 refills | Status: AC

## 2019-03-30 NOTE — Telephone Encounter (Signed)
Done and pt was advised KH 

## 2019-03-30 NOTE — Patient Instructions (Signed)

## 2019-03-30 NOTE — Addendum Note (Signed)
Addended by: Tobi Bastos on: 03/30/2019 04:53 PM   Modules accepted: Orders

## 2019-03-30 NOTE — Progress Notes (Signed)
43 y.o. [id number] Married Black or [place] American Not Hispanic or Latino female here for annual exam.  Her 39 year old Father died a few weeks ago. CHF, dementia. Because of Covid and her working virtually she was able to spend 6+ weeks with him. She has 6 brothers no sisters. Her mother is doing okay, has support.     Period Cycle (Days): 28 Period Duration (Days): 3-4 days Period Pattern: Regular Menstrual Flow: Heavy, Moderate Menstrual Control: Thin pad Menstrual Control Change Freq (Hours): changes pad every 2 hours Dysmenorrhea: (!) Severe Dysmenorrhea Symptoms: Cramping  The cramps are bad for one day, helped with OTC medication. No dyspareunia.   Patient's last menstrual period was 03/22/2019 (exact date).          Sexually active: Yes.    The current method of family planning is condoms most of the time.    Exercising: Yes.    beach body workouts, walking Smoker:  no  Health Maintenance: Pap:  02-09-16 WNL NEG HR HPV History of abnormal Pap:  no MMG:  03-05-16 breast U/S WNL  Colonoscopy:  Never BMD:   Never TDaP:  2012 Gardasil: No   reports that she has never smoked. She has never used smokeless tobacco. She reports that she does not drink alcohol or use drugs. 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 3 kids, 2 step children.   Past Medical History:  Diagnosis Date  . Genital herpes   . History of trichomoniasis   . Hypertension    under control with meds., has been on med. x 2 yr.  . Hypokalemia 03/05/2018  . Prediabetes 03/05/2018  . Umbilical hernia 10/2012  . Vitamin D deficiency 03/05/2018   Started on high dose vitamin D on 03/05/2018    Past Surgical History:  Procedure Laterality Date  . INSERTION OF MESH  10/15/2012   Procedure: INSERTION OF MESH;  Surgeon: Robyne Askew, MD;  Location: [facility];  Service: General;  Laterality: N/A;  . UMBILICAL HERNIA REPAIR  10/15/2012   Procedure: HERNIA REPAIR UMBILICAL ADULT;  Surgeon: Robyne Askew, MD;  Location: [facility];  Service: General;  Laterality: N/A;  umbilical hernia repair with mesh  . WISDOM TOOTH EXTRACTION     as a teenager    Current Outpatient Medications  Medication Sig Dispense Refill  . amLODipine (NORVASC) 10 MG tablet Take 1 tablet (10 mg total) by mouth daily. 30 tablet 12  . cetirizine (ZYRTEC) 10 MG tablet Take 10 mg by mouth daily as needed for allergies.     . cyclobenzaprine (FLEXERIL) 5 MG tablet Take 1-2 tablets (5-10 mg total) by mouth 3 (three) times daily as needed for muscle spasms. 20 tablet 0  . diphenhydramine-acetaminophen (TYLENOL PM) 25-500 MG TABS tablet Take 2 tablets by mouth at bedtime as needed.    . lidocaine (XYLOCAINE) 5 % ointment Apply 1 application topically 4 (four) times daily as needed. 1.25 g 0  . lisinopril-hydrochlorothiazide (ZESTORETIC) 20-25 MG tablet Take 1 tablet by mouth once daily 90 tablet 0  . montelukast (SINGULAIR) 10 MG tablet Take 1 tablet (10 mg total) by mouth at bedtime. 90 tablet 1  . naproxen sodium (ANAPROX DS) 550 MG tablet Take 1 tablet (550 mg total) by mouth 2 (two) times daily with a meal. 30 tablet 0  . valACYclovir (VALTREX) 500 MG tablet Take one tablet a day for 3 days prn 30 tablet 1  . fluticasone (FLONASE)  50 MCG/ACT nasal spray Place 2 sprays into both nostrils daily. (Patient not taking: Reported on 01/12/2019) 48 g 3   No current facility-administered medications for this visit.     Family History  Problem Relation Age of Onset  . Breast cancer Maternal Aunt   . Diabetes Brother   . Heart disease Father        CHF  . Hypertension Father   . Hypertension Mother   . Thyroid disease Mother   . Colon cancer Neg Hx   . Colon polyps Neg Hx   . Kidney disease Neg Hx   . Esophageal cancer Neg Hx     Review of Systems  Constitutional: Negative.   HENT: Negative.   Eyes: Negative.   Respiratory: Negative.   Cardiovascular: Negative.   Gastrointestinal:       Pain  around umbilicus  Endocrine: Negative.   Genitourinary: Negative.   Musculoskeletal: Negative.   Skin: Negative.   Allergic/Immunologic: Negative.   Neurological: Negative.   Hematological: Negative.   Psychiatric/Behavioral: Negative.   She has a h/o repair of an umbilical hernia, she is starting to have similar discomfort. No bulge. No diarrhea or constipation. The discomfort worsens with drinking water.   Exam:   BP (!) 142/90 (BP Location: Left Arm, Patient Position: Sitting, Cuff Size: Normal)   Pulse 80   Temp 97.8 F (36.6 C) (Skin)   Ht 5\' 8"  (1.727 m)   Wt 262 lb (118.8 kg)   LMP 03/22/2019 (Exact Date)   BMI 39.84 kg/m   Weight change: @WEIGHTCHANGE @ Height:   Height: 5\' 8"  (172.7 cm)  Ht Readings from Last 3 Encounters:  03/30/19 5\' 8"  (1.727 m)  01/12/19 5' 8.5" (1.74 m)  04/16/18 5\' 9"  (1.753 m)    General appearance: alert, cooperative and appears stated age Head: Normocephalic, without obvious abnormality, atraumatic Neck: no adenopathy, supple, symmetrical, trachea midline and thyroid normal to inspection and palpation Lungs: clear to auscultation bilaterally Cardiovascular: regular rate and rhythm Breasts: normal appearance, no masses or tenderness Abdomen: soft, non-tender; non distended,  no masses,  no organomegaly Extremities: extremities normal, atraumatic, no cyanosis or edema Skin: Skin color, texture, turgor normal. No rashes or lesions Lymph nodes: Cervical, supraclavicular, and axillary nodes normal. No abnormal inguinal nodes palpated Neurologic: Grossly normal   Pelvic: External genitalia:  no lesions              Urethra:  normal appearing urethra with no masses, tenderness or lesions              Bartholins and Skenes: normal                 Vagina: normal appearing vagina with normal color and discharge, no lesions              Cervix: no lesions               Bimanual Exam:  Uterus:  normal size, contour, position, consistency, mobility,  non-tender              Adnexa: no mass, fullness, tenderness               Rectovaginal: Confirms               Anus:  normal sphincter tone, no lesions  Chaperone was present for exam.  A:  Well Woman with normal exam  HTN, pre-diabetes, will f/u with primary  P:   Pap with hpv  She will  set up her mammogram  Labs with primary  Information on breast self exam given

## 2019-03-30 NOTE — Telephone Encounter (Signed)
Pt called and left voice mail requesting refills on Lisinopril, Amlodipine and Singulair. Pt can be reached at [phone].

## 2019-03-30 NOTE — Progress Notes (Addendum)
  Review of Systems  Constitutional: Negative.   HENT: Negative.   Eyes: Negative.   Respiratory: Negative.   Cardiovascular: Negative.   Gastrointestinal:       Pain around umbilicus  Endocrine: Negative.   Genitourinary: Negative.   Musculoskeletal: Negative.   Skin: Negative.   Allergic/Immunologic: Negative.   Neurological: Negative.   Hematological: Negative.   Psychiatric/Behavioral: Negative.

## 2019-03-31 ENCOUNTER — Other Ambulatory Visit: Payer: Self-pay | Admitting: Obstetrics and Gynecology

## 2019-03-31 DIAGNOSIS — Z1231 Encounter for screening mammogram for malignant neoplasm of breast: Secondary | ICD-10-CM

## 2019-04-01 LAB — CYTOLOGY - PAP
Diagnosis: NEGATIVE
HPV: NOT DETECTED

## 2019-04-25 ENCOUNTER — Other Ambulatory Visit: Payer: Self-pay

## 2019-04-25 ENCOUNTER — Ambulatory Visit
Admission: RE | Admit: 2019-04-25 | Discharge: 2019-04-25 | Disposition: A | Discharge status: Home / Self Care | Payer: BC Managed Care – PPO | Source: Ambulatory Visit | Attending: Obstetrics and Gynecology | Admitting: Obstetrics and Gynecology

## 2019-04-25 DIAGNOSIS — Z1231 Encounter for screening mammogram for malignant neoplasm of breast: Secondary | ICD-10-CM

## 2019-04-27 ENCOUNTER — Other Ambulatory Visit: Payer: Self-pay | Admitting: Obstetrics and Gynecology

## 2019-04-27 DIAGNOSIS — N644 Mastodynia: Secondary | ICD-10-CM

## 2019-05-06 ENCOUNTER — Ambulatory Visit
Admission: RE | Admit: 2019-05-06 | Discharge: 2019-05-06 | Disposition: A | Discharge status: Home / Self Care | Payer: BC Managed Care – PPO | Source: Ambulatory Visit | Attending: Obstetrics and Gynecology | Admitting: Obstetrics and Gynecology

## 2019-05-06 ENCOUNTER — Ambulatory Visit: Payer: BC Managed Care – PPO

## 2019-05-06 ENCOUNTER — Other Ambulatory Visit: Payer: Self-pay

## 2019-05-06 DIAGNOSIS — N644 Mastodynia: Secondary | ICD-10-CM

## 2020-01-28 IMAGING — MG DIGITAL DIAGNOSTIC BILATERAL MAMMOGRAM WITH TOMO AND CAD
8 series · 8 of 24 positions shown · non-contrast
Comparison: Previous exam(s).

CLINICAL DATA: 42-year-old female with deep nonfocal left breast
pain.

EXAM:
DIGITAL DIAGNOSTIC BILATERAL MAMMOGRAM WITH CAD AND TOMO

[L MLO synth-2D]
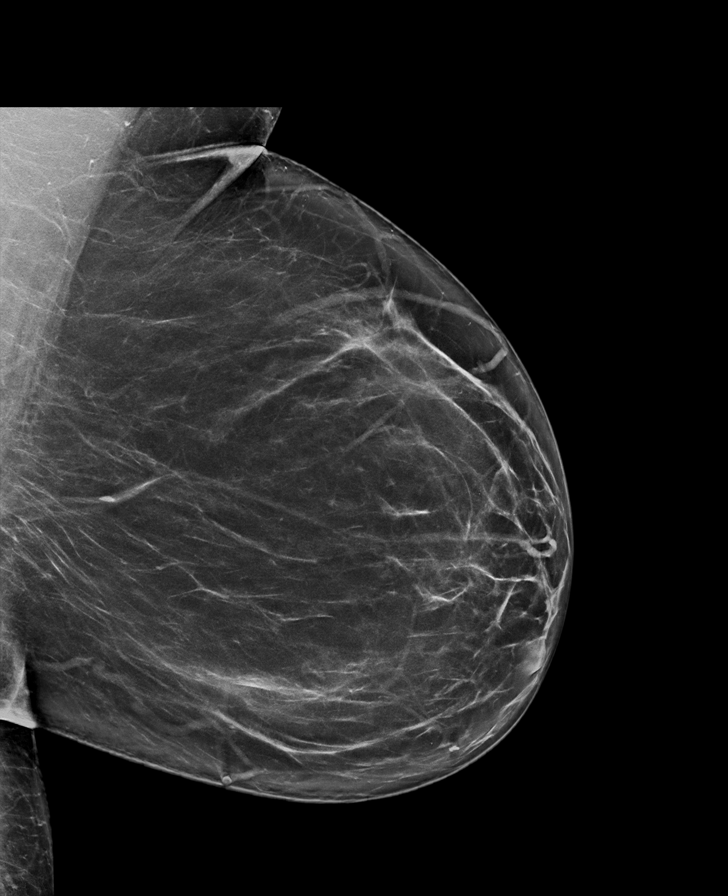

[R CC synth-2D]
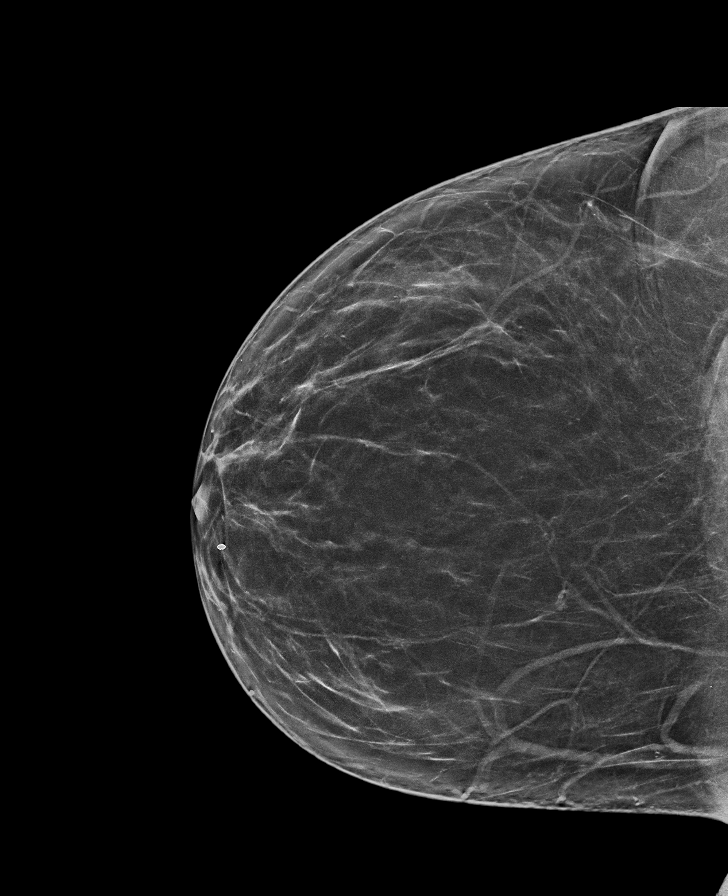

[L CC synth-2D]
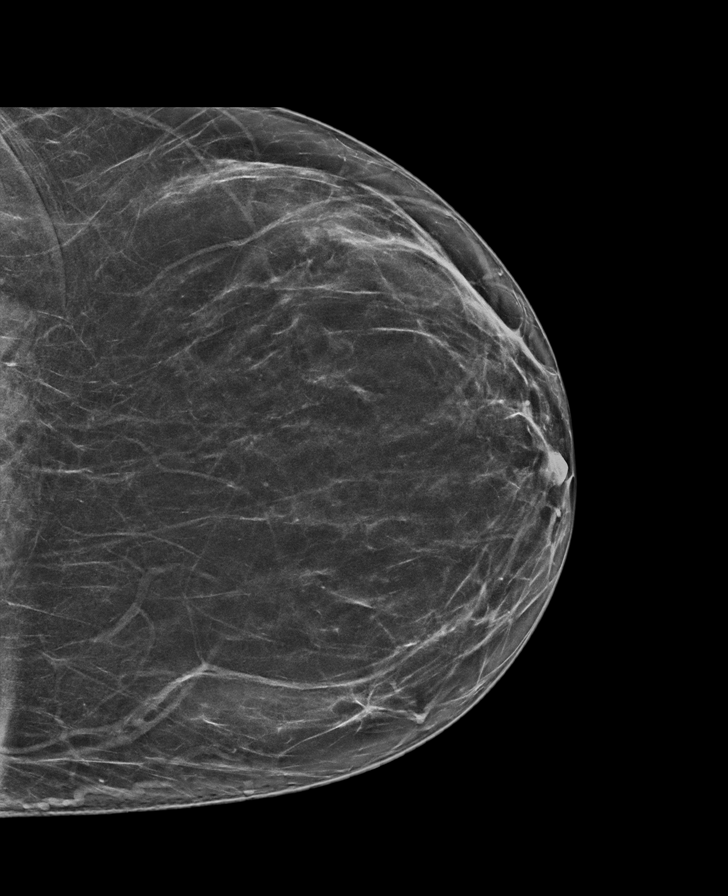

[R MLO synth-2D]
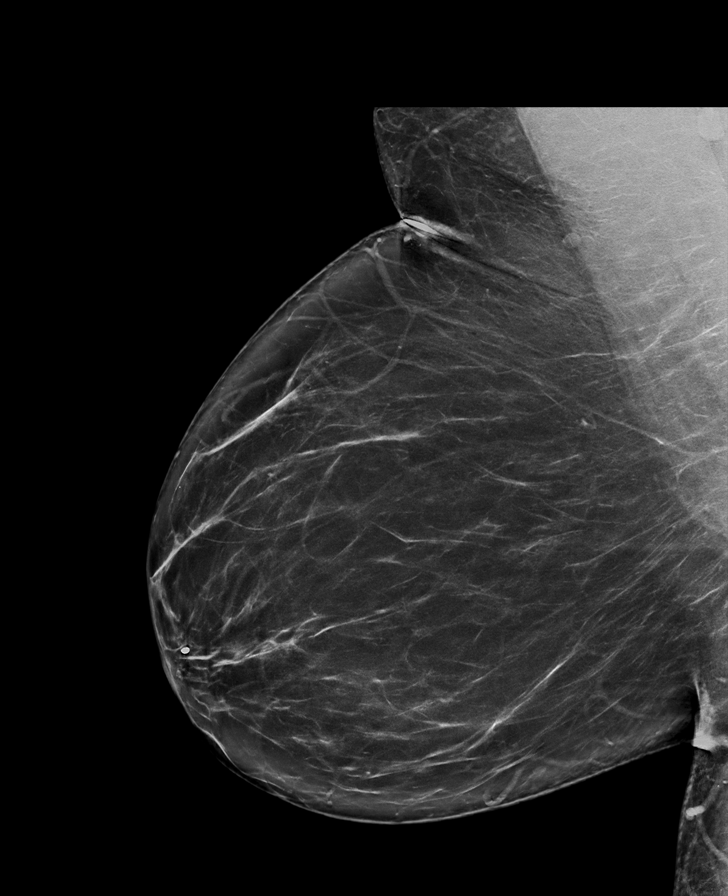

[R MLO tomo · tomo slice 47/92.0]
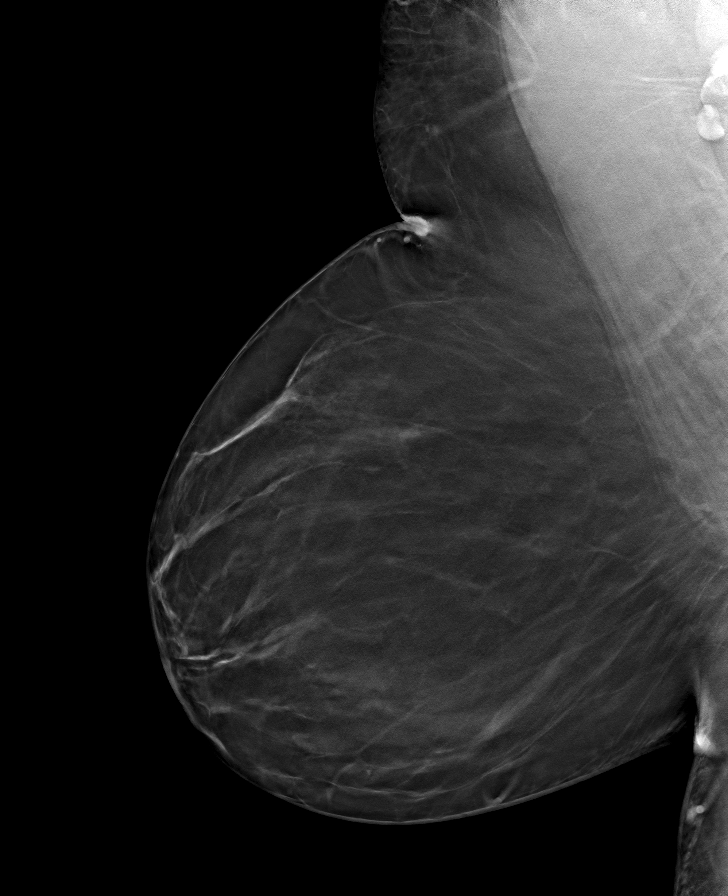

[R CC tomo · tomo slice 41/80.0]
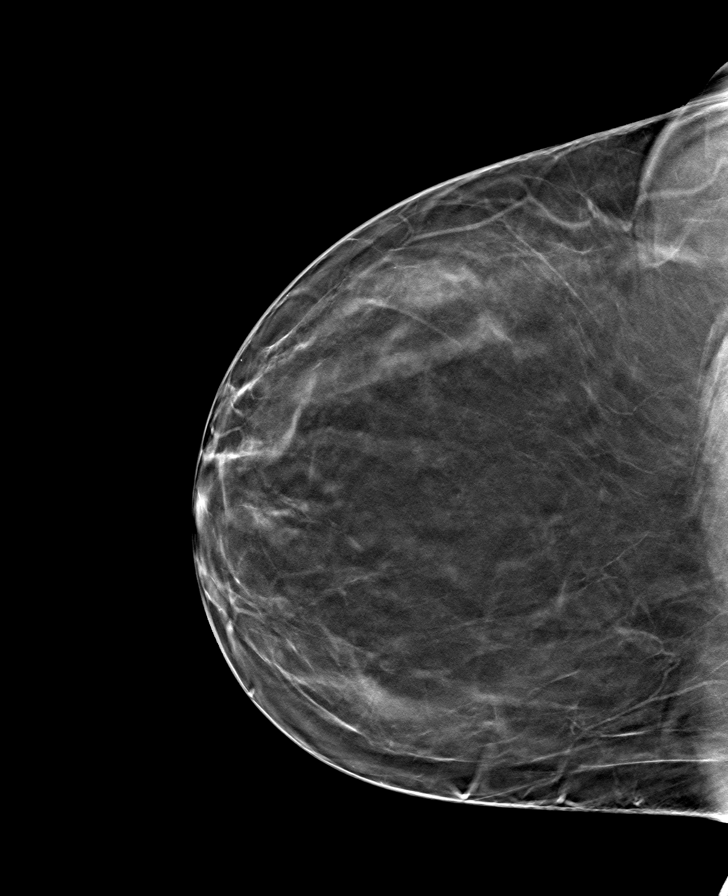

[L CC tomo · tomo slice 41/80.0]
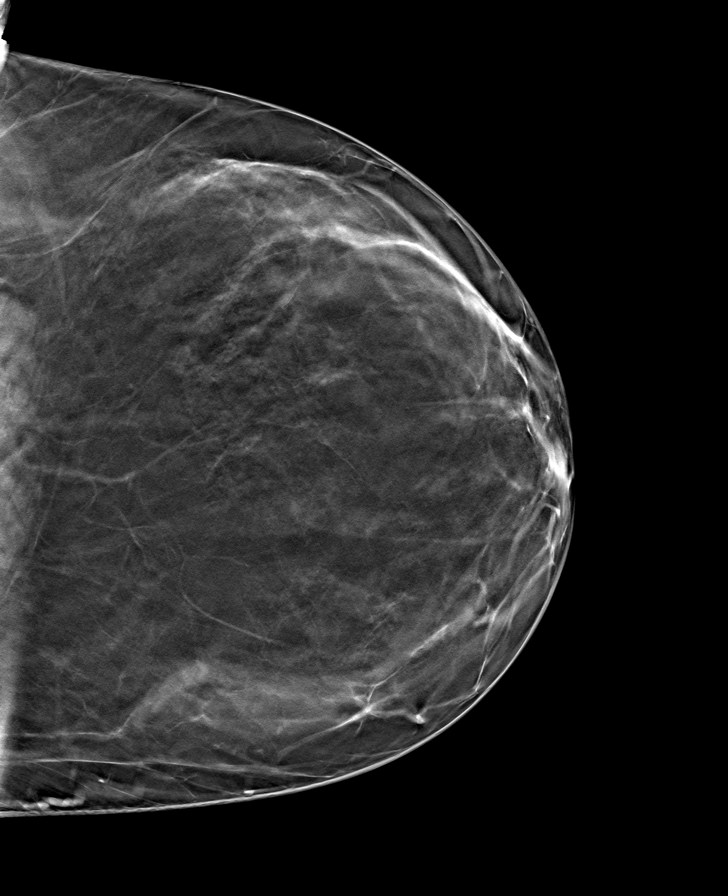

[L MLO tomo · tomo slice 47/92.0]
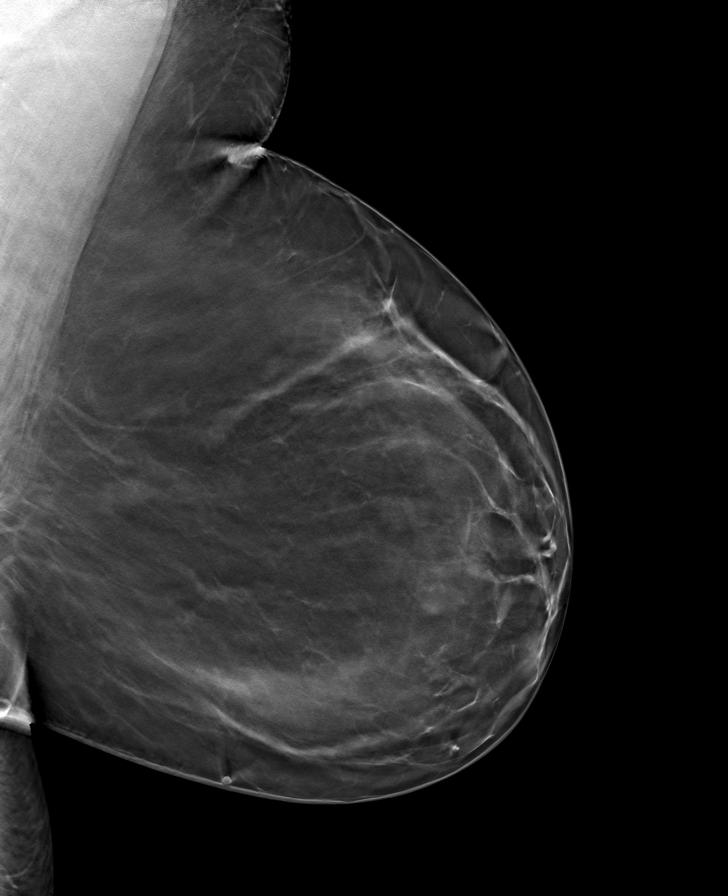

[8 of 24 positions shown; findings below may reference images not displayed]

ACR Breast Density Category b: There are scattered areas of
fibroglandular density.
FINDINGS: No suspicious masses or calcifications are seen in either breast.
There is no mammographic evidence of malignancy in either breast.

Mammographic images were processed with CAD.
IMPRESSION: No mammographic evidence of malignancy in either breast.

RECOMMENDATION:
1. Recommend further management of nonfocal left breast pain be
based on clinical assessment.

2.  Screening mammogram in one year.(Code:[id number])

I have discussed the findings and recommendations with the patient.
Results were also provided in writing at the conclusion of the
visit. If applicable, a reminder letter will be sent to the patient
regarding the next appointment.

BI-RADS CATEGORY  1: Negative.

## 2020-03-28 NOTE — Progress Notes (Deleted)
44 y.o. [id number] Married Black or [place] American Not Hispanic or Latino female here for annual exam.      No LMP recorded.          Sexually active: {yes no:314532}  The current method of family planning is {contraception:315051}.    Exercising: {yes no:314532}  {types:19826} Smoker:  {YES [id number]  Health Maintenance: Pap:  03/30/19 Normal HPV Neg, 02/09/16 Normal HPV Neg  History of abnormal Pap:  no MMG:  05/06/19 Density B Bi-rads 1 neg  BMD:   Never Colonoscopy: never  TDaP:  2012 Gardasil:no   reports that she has never smoked. She has never used smokeless tobacco. She reports that she does not drink alcohol or use drugs.  Past Medical History:  Diagnosis Date  . Genital herpes   . History of trichomoniasis   . Hypertension    under control with meds., has been on med. x 2 yr.  . Hypokalemia 03/05/2018  . Prediabetes 03/05/2018  . Umbilical hernia 10/2012  . Vitamin D deficiency 03/05/2018   Started on high dose vitamin D on 03/05/2018    Past Surgical History:  Procedure Laterality Date  . INSERTION OF MESH  10/15/2012   Procedure: INSERTION OF MESH;  Surgeon: Robyne Askew, MD;  Location: [facility];  Service: General;  Laterality: N/A;  . UMBILICAL HERNIA REPAIR  10/15/2012   Procedure: HERNIA REPAIR UMBILICAL ADULT;  Surgeon: Robyne Askew, MD;  Location: [facility];  Service: General;  Laterality: N/A;  umbilical hernia repair with mesh  . WISDOM TOOTH EXTRACTION     as a teenager    Current Outpatient Medications  Medication Sig Dispense Refill  . amLODipine (NORVASC) 10 MG tablet Take 1 tablet (10 mg total) by mouth daily. 30 tablet 12  . cetirizine (ZYRTEC) 10 MG tablet Take 10 mg by mouth daily as needed for allergies.     . cyclobenzaprine (FLEXERIL) 5 MG tablet Take 1-2 tablets (5-10 mg total) by mouth 3 (three) times daily as needed for muscle spasms. 20 tablet 0  . diphenhydramine-acetaminophen (TYLENOL PM) 25-500 MG TABS  tablet Take 2 tablets by mouth at bedtime as needed.    . fluticasone (FLONASE) 50 MCG/ACT nasal spray Place 2 sprays into both nostrils daily. (Patient not taking: Reported on 01/12/2019) 48 g 3  . lidocaine (XYLOCAINE) 5 % ointment Apply 1 application topically 4 (four) times daily as needed. 1.25 g 0  . lisinopril-hydrochlorothiazide (ZESTORETIC) 20-25 MG tablet Take 1 tablet by mouth once daily 90 tablet 0  . montelukast (SINGULAIR) 10 MG tablet Take 1 tablet (10 mg total) by mouth at bedtime. 90 tablet 1  . naproxen sodium (ANAPROX DS) 550 MG tablet Take 1 tablet (550 mg total) by mouth 2 (two) times daily with a meal. 30 tablet 0  . valACYclovir (VALTREX) 500 MG tablet Take one tablet a day for 3 days prn 30 tablet 1   No current facility-administered medications for this visit.    Family History  Problem Relation Age of Onset  . Breast cancer Maternal Aunt   . Diabetes Brother   . Heart disease Father        CHF  . Hypertension Father   . Hypertension Mother   . Thyroid disease Mother   . Colon cancer Neg Hx   . Colon polyps Neg Hx   . Kidney disease Neg Hx   . Esophageal cancer Neg Hx     Review of  Systems  Exam:   There were no vitals taken for this visit.  Weight change: @WEIGHTCHANGE @ Height:      Ht Readings from Last 3 Encounters:  03/30/19 5\' 8"  (1.727 m)  01/12/19 5' 8.5" (1.74 m)  04/16/18 5\' 9"  (1.753 m)    General appearance: alert, cooperative and appears stated age Head: Normocephalic, without obvious abnormality, atraumatic Neck: no adenopathy, supple, symmetrical, trachea midline and thyroid {CHL AMB PHY EX THYROID NORM DEFAULT:[phone]::"normal to inspection and palpation"} Lungs: clear to auscultation bilaterally Cardiovascular: regular rate and rhythm Breasts: {Exam; breast:13139::"normal appearance, no masses or tenderness"} Abdomen: soft, non-tender; non distended,  no masses,  no organomegaly Extremities: extremities normal, atraumatic, no  cyanosis or edema Skin: Skin color, texture, turgor normal. No rashes or lesions Lymph nodes: Cervical, supraclavicular, and axillary nodes normal. No abnormal inguinal nodes palpated Neurologic: Grossly normal   Pelvic: External genitalia:  no lesions              Urethra:  normal appearing urethra with no masses, tenderness or lesions              Bartholins and Skenes: normal                 Vagina: normal appearing vagina with normal color and discharge, no lesions              Cervix: {CHL AMB PHY EX CERVIX NORM [phone]::"no lesions"}               Bimanual Exam:  Uterus:  {CHL AMB PHY EX UTERUS NORM DEFAULT:[id number]::"normal size, contour, position, consistency, mobility, non-tender"}              Adnexa: {CHL AMB PHY EX ADNEXA NO MASS [phone]::"no mass, fullness, tenderness"}               Rectovaginal: Confirms               Anus:  normal sphincter tone, no lesions  *** chaperoned for the exam.  A:  Well Woman with normal exam  P:

## 2020-03-30 ENCOUNTER — Ambulatory Visit: Payer: BC Managed Care – PPO | Admitting: Obstetrics and Gynecology

## 2020-04-21 ENCOUNTER — Other Ambulatory Visit: Payer: Self-pay

## 2020-04-21 NOTE — Progress Notes (Deleted)
44 y.o. [id number] Married Black or [place] American Not Hispanic or Latino female here for annual exam.      No LMP recorded.          Sexually active: {yes no:314532}  The current method of family planning is {contraception:315051}.    Exercising: {yes no:314532}  {types:19826} Smoker:  {YES NO:22349}  Health Maintenance: Pap:  03/30/19 WNL HR HPV Neg,02-09-16 WNL NEG HR HPV History of abnormal Pap:  no MMG:  05/06/19 density B Bi-rads 1 neg   BMD:   *** Colonoscopy: *** TDaP:  2012 Gardasil: ***   reports that she has never smoked. She has never used smokeless tobacco. She reports that she does not drink alcohol and does not use drugs.  Past Medical History:  Diagnosis Date  . Genital herpes   . History of trichomoniasis   . Hypertension    under control with meds., has been on med. x 2 yr.  . Hypokalemia 03/05/2018  . Prediabetes 03/05/2018  . Umbilical hernia 00/9381  . Vitamin D deficiency 03/05/2018   Started on high dose vitamin D on 03/05/2018    Past Surgical History:  Procedure Laterality Date  . INSERTION OF MESH  10/15/2012   Procedure: INSERTION OF MESH;  Surgeon: Merrie Roof, MD;  Location: [facility];  Service: General;  Laterality: N/A;  . UMBILICAL HERNIA REPAIR  10/15/2012   Procedure: HERNIA REPAIR UMBILICAL ADULT;  Surgeon: Merrie Roof, MD;  Location: [facility];  Service: General;  Laterality: N/A;  umbilical hernia repair with mesh  . WISDOM TOOTH EXTRACTION     as a teenager    Current Outpatient Medications  Medication Sig Dispense Refill  . amLODipine (NORVASC) 10 MG tablet Take 1 tablet (10 mg total) by mouth daily. 30 tablet 12  . cetirizine (ZYRTEC) 10 MG tablet Take 10 mg by mouth daily as needed for allergies.     . cyclobenzaprine (FLEXERIL) 5 MG tablet Take 1-2 tablets (5-10 mg total) by mouth 3 (three) times daily as needed for muscle spasms. 20 tablet 0  . diphenhydramine-acetaminophen (TYLENOL PM) 25-500  MG TABS tablet Take 2 tablets by mouth at bedtime as needed.    . fluticasone (FLONASE) 50 MCG/ACT nasal spray Place 2 sprays into both nostrils daily. (Patient not taking: Reported on 01/12/2019) 48 g 3  . lidocaine (XYLOCAINE) 5 % ointment Apply 1 application topically 4 (four) times daily as needed. 1.25 g 0  . lisinopril-hydrochlorothiazide (ZESTORETIC) 20-25 MG tablet Take 1 tablet by mouth once daily 90 tablet 0  . montelukast (SINGULAIR) 10 MG tablet Take 1 tablet (10 mg total) by mouth at bedtime. 90 tablet 1  . naproxen sodium (ANAPROX DS) 550 MG tablet Take 1 tablet (550 mg total) by mouth 2 (two) times daily with a meal. 30 tablet 0  . valACYclovir (VALTREX) 500 MG tablet Take one tablet a day for 3 days prn 30 tablet 1   No current facility-administered medications for this visit.    Family History  Problem Relation Age of Onset  . Breast cancer Maternal Aunt   . Diabetes Brother   . Heart disease Father        CHF  . Hypertension Father   . Hypertension Mother   . Thyroid disease Mother   . Colon cancer Neg Hx   . Colon polyps Neg Hx   . Kidney disease Neg Hx   . Esophageal cancer Neg Hx  Review of Systems  Exam:   There were no vitals taken for this visit.  Weight change: @WEIGHTCHANGE @ Height:      Ht Readings from Last 3 Encounters:  03/30/19 5\' 8"  (1.727 m)  01/12/19 5' 8.5" (1.74 m)  04/16/18 5\' 9"  (1.753 m)    General appearance: alert, cooperative and appears stated age Head: Normocephalic, without obvious abnormality, atraumatic Neck: no adenopathy, supple, symmetrical, trachea midline and thyroid {CHL AMB PHY EX THYROID NORM [phone]::"normal to inspection and palpation"} Lungs: clear to auscultation bilaterally Cardiovascular: regular rate and rhythm Breasts: {Exam; breast:13139::"normal appearance, no masses or tenderness"} Abdomen: soft, non-tender; non distended,  no masses,  no organomegaly Extremities: extremities normal, atraumatic,  no cyanosis or edema Skin: Skin color, texture, turgor normal. No rashes or lesions Lymph nodes: Cervical, supraclavicular, and axillary nodes normal. No abnormal inguinal nodes palpated Neurologic: Grossly normal   Pelvic: External genitalia:  no lesions              Urethra:  normal appearing urethra with no masses, tenderness or lesions              Bartholins and Skenes: normal                 Vagina: normal appearing vagina with normal color and discharge, no lesions              Cervix: {CHL AMB PHY EX CERVIX NORM DEFAULT:[phone]::"no lesions"}               Bimanual Exam:  Uterus:  {CHL AMB PHY EX UTERUS NORM [phone]::"normal size, contour, position, consistency, mobility, non-tender"}              Adnexa: {CHL AMB PHY EX ADNEXA NO MASS DEFAULT:[phone]::"no mass, fullness, tenderness"}               Rectovaginal: Confirms               Anus:  normal sphincter tone, no lesions  *** chaperoned for the exam.  A:  Well Woman with normal exam  P:

## 2020-04-22 ENCOUNTER — Ambulatory Visit: Payer: BC Managed Care – PPO | Admitting: Obstetrics and Gynecology
